# Patient Record
Sex: Female | Born: 1945 | Race: White | Hispanic: No | Smoking: Never smoker
Health system: Southern US, Community
[De-identification: ages and names within clinical notes are randomized; demographics above are authoritative.]

## PROBLEM LIST (undated history)

## (undated) DIAGNOSIS — E785 Hyperlipidemia, unspecified: Secondary | ICD-10-CM

## (undated) DIAGNOSIS — N200 Calculus of kidney: Secondary | ICD-10-CM

## (undated) DIAGNOSIS — M810 Age-related osteoporosis without current pathological fracture: Secondary | ICD-10-CM

## (undated) DIAGNOSIS — N6019 Diffuse cystic mastopathy of unspecified breast: Secondary | ICD-10-CM

## (undated) DIAGNOSIS — Z87442 Personal history of urinary calculi: Secondary | ICD-10-CM

## (undated) HISTORY — DX: Calculus of kidney: N20.0

---
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
## 2004-04-13 ENCOUNTER — Ambulatory Visit: Payer: Self-pay | Admitting: Internal Medicine

## 2005-05-03 ENCOUNTER — Ambulatory Visit: Payer: Self-pay | Admitting: Internal Medicine

## 2006-05-08 ENCOUNTER — Ambulatory Visit: Payer: Self-pay | Admitting: Internal Medicine

## 2007-06-10 ENCOUNTER — Ambulatory Visit: Payer: Self-pay | Admitting: Internal Medicine

## 2008-06-17 ENCOUNTER — Ambulatory Visit: Payer: Self-pay | Admitting: Internal Medicine

## 2009-06-20 ENCOUNTER — Ambulatory Visit: Payer: Self-pay | Admitting: Internal Medicine

## 2010-06-21 ENCOUNTER — Ambulatory Visit: Payer: Self-pay | Admitting: Internal Medicine

## 2010-08-11 ENCOUNTER — Ambulatory Visit: Payer: Self-pay | Admitting: Gastroenterology

## 2010-08-15 LAB — PATHOLOGY REPORT

## 2011-02-20 ENCOUNTER — Ambulatory Visit: Payer: Self-pay

## 2011-06-26 ENCOUNTER — Ambulatory Visit: Payer: Self-pay | Admitting: Internal Medicine

## 2011-07-02 ENCOUNTER — Ambulatory Visit: Payer: Self-pay | Admitting: Internal Medicine

## 2012-02-26 ENCOUNTER — Ambulatory Visit: Payer: Self-pay | Admitting: Internal Medicine

## 2012-02-26 LAB — URINALYSIS, COMPLETE
Bilirubin,UR: NEGATIVE
Ketone: NEGATIVE
Ph: 8 (ref 4.5–8.0)
RBC,UR: >30 /HPF (ref 0–5)
Specific Gravity: 1.005 (ref 1.003–1.030)
WBC UR: >30 /HPF (ref 0–5)

## 2012-02-28 LAB — URINE CULTURE

## 2012-06-11 ENCOUNTER — Ambulatory Visit: Payer: Self-pay | Admitting: Family Medicine

## 2012-07-28 ENCOUNTER — Ambulatory Visit: Payer: Self-pay | Admitting: Internal Medicine

## 2013-08-25 ENCOUNTER — Ambulatory Visit: Payer: Self-pay | Admitting: Internal Medicine

## 2014-08-16 DIAGNOSIS — M810 Age-related osteoporosis without current pathological fracture: Secondary | ICD-10-CM | POA: Insufficient documentation

## 2014-08-16 DIAGNOSIS — E785 Hyperlipidemia, unspecified: Secondary | ICD-10-CM | POA: Insufficient documentation

## 2014-08-27 ENCOUNTER — Ambulatory Visit: Payer: Self-pay | Admitting: Internal Medicine

## 2015-08-12 DIAGNOSIS — D1801 Hemangioma of skin and subcutaneous tissue: Secondary | ICD-10-CM | POA: Diagnosis not present

## 2015-08-12 DIAGNOSIS — Z1283 Encounter for screening for malignant neoplasm of skin: Secondary | ICD-10-CM | POA: Diagnosis not present

## 2015-08-12 DIAGNOSIS — L821 Other seborrheic keratosis: Secondary | ICD-10-CM | POA: Diagnosis not present

## 2015-08-12 DIAGNOSIS — D2371 Other benign neoplasm of skin of right lower limb, including hip: Secondary | ICD-10-CM | POA: Diagnosis not present

## 2015-08-26 DIAGNOSIS — Z0001 Encounter for general adult medical examination with abnormal findings: Secondary | ICD-10-CM | POA: Diagnosis not present

## 2015-08-26 DIAGNOSIS — E7800 Pure hypercholesterolemia, unspecified: Secondary | ICD-10-CM | POA: Diagnosis not present

## 2015-08-26 DIAGNOSIS — Z1231 Encounter for screening mammogram for malignant neoplasm of breast: Secondary | ICD-10-CM | POA: Diagnosis not present

## 2015-08-26 DIAGNOSIS — M810 Age-related osteoporosis without current pathological fracture: Secondary | ICD-10-CM | POA: Diagnosis not present

## 2015-09-08 ENCOUNTER — Other Ambulatory Visit: Payer: Self-pay | Admitting: Internal Medicine

## 2015-09-08 DIAGNOSIS — Z1231 Encounter for screening mammogram for malignant neoplasm of breast: Secondary | ICD-10-CM

## 2015-09-15 ENCOUNTER — Ambulatory Visit
Admission: RE | Admit: 2015-09-15 | Discharge: 2015-09-15 | Disposition: A | Discharge status: Home / Self Care | Payer: PPO | Source: Ambulatory Visit | Attending: Internal Medicine | Admitting: Internal Medicine

## 2015-09-15 DIAGNOSIS — Z1231 Encounter for screening mammogram for malignant neoplasm of breast: Secondary | ICD-10-CM | POA: Diagnosis not present

## 2015-09-15 DIAGNOSIS — K625 Hemorrhage of anus and rectum: Secondary | ICD-10-CM | POA: Diagnosis not present

## 2015-09-15 DIAGNOSIS — Z86010 Personal history of colonic polyps: Secondary | ICD-10-CM | POA: Diagnosis not present

## 2015-10-20 DIAGNOSIS — H2513 Age-related nuclear cataract, bilateral: Secondary | ICD-10-CM | POA: Diagnosis not present

## 2015-10-20 DIAGNOSIS — H401131 Primary open-angle glaucoma, bilateral, mild stage: Secondary | ICD-10-CM | POA: Diagnosis not present

## 2015-10-28 ENCOUNTER — Encounter: Payer: Self-pay | Admitting: *Deleted

## 2015-11-01 ENCOUNTER — Ambulatory Visit
Admission: RE | Admit: 2015-11-01 | Discharge: 2015-11-01 | Disposition: A | Discharge status: Home / Self Care | Payer: PPO | Source: Ambulatory Visit | Attending: Gastroenterology | Admitting: Gastroenterology

## 2015-11-01 ENCOUNTER — Encounter
Admission: RE | Disposition: A | Discharge status: Home / Self Care | Payer: Self-pay | Source: Ambulatory Visit | Attending: Gastroenterology

## 2015-11-01 ENCOUNTER — Encounter: Payer: Self-pay | Admitting: *Deleted

## 2015-11-01 ENCOUNTER — Ambulatory Visit: Payer: PPO | Admitting: Anesthesiology

## 2015-11-01 DIAGNOSIS — D124 Benign neoplasm of descending colon: Secondary | ICD-10-CM | POA: Diagnosis not present

## 2015-11-01 DIAGNOSIS — E785 Hyperlipidemia, unspecified: Secondary | ICD-10-CM | POA: Diagnosis not present

## 2015-11-01 DIAGNOSIS — K5790 Diverticulosis of intestine, part unspecified, without perforation or abscess without bleeding: Secondary | ICD-10-CM | POA: Diagnosis not present

## 2015-11-01 DIAGNOSIS — M810 Age-related osteoporosis without current pathological fracture: Secondary | ICD-10-CM | POA: Diagnosis not present

## 2015-11-01 DIAGNOSIS — Z79899 Other long term (current) drug therapy: Secondary | ICD-10-CM | POA: Diagnosis not present

## 2015-11-01 DIAGNOSIS — K5730 Diverticulosis of large intestine without perforation or abscess without bleeding: Secondary | ICD-10-CM | POA: Insufficient documentation

## 2015-11-01 DIAGNOSIS — K635 Polyp of colon: Secondary | ICD-10-CM | POA: Diagnosis not present

## 2015-11-01 DIAGNOSIS — Z86010 Personal history of colonic polyps: Secondary | ICD-10-CM | POA: Insufficient documentation

## 2015-11-01 DIAGNOSIS — N6019 Diffuse cystic mastopathy of unspecified breast: Secondary | ICD-10-CM | POA: Diagnosis not present

## 2015-11-01 DIAGNOSIS — K644 Residual hemorrhoidal skin tags: Secondary | ICD-10-CM | POA: Insufficient documentation

## 2015-11-01 HISTORY — PX: COLONOSCOPY WITH PROPOFOL: SHX5780

## 2015-11-01 HISTORY — DX: Hyperlipidemia, unspecified: E78.5

## 2015-11-01 HISTORY — DX: Age-related osteoporosis without current pathological fracture: M81.0

## 2015-11-01 HISTORY — DX: Diffuse cystic mastopathy of unspecified breast: N60.19

## 2015-11-01 SURGERY — COLONOSCOPY WITH PROPOFOL
Anesthesia: General

## 2015-11-01 MED ORDER — SODIUM CHLORIDE 0.9 % IV SOLN
INTRAVENOUS | Status: DC
  Administered 2015-11-01: 1000 mL via INTRAVENOUS

## 2015-11-01 MED ORDER — SODIUM CHLORIDE 0.9 % IV SOLN: INTRAVENOUS | Status: DC

## 2015-11-01 MED ORDER — PROPOFOL 500 MG/50ML IV EMUL
INTRAVENOUS | Status: DC | PRN
  Administered 2015-11-01: 100 ug/kg/min via INTRAVENOUS

## 2015-11-01 MED ORDER — FENTANYL CITRATE (PF) 100 MCG/2ML IJ SOLN
INTRAMUSCULAR | Status: DC | PRN
  Administered 2015-11-01: 50 ug via INTRAVENOUS

## 2015-11-01 NOTE — Transfer of Care (Signed)
Immediate Anesthesia Transfer of Care Note  Patient: Linda Gentry  Procedure(s) Performed: Procedure(s): COLONOSCOPY WITH PROPOFOL (N/A)  Patient Location: PACU and Endoscopy Unit  Anesthesia Type:General  Level of Consciousness: sedated and responds to stimulation  Airway & Oxygen Therapy: Patient Spontanous Breathing and Patient connected to nasal cannula oxygen  Post-op Assessment: Report given to RN and Post -op Vital signs reviewed and stable  Post vital signs: Reviewed and stable  Last Vitals:  Filed Vitals:   11/01/15 0939 11/01/15 1136  BP: 144/72 120/66  Pulse: 75   Temp: 36.5 C 36.1 C  Resp: 16 14    Last Pain: There were no vitals filed for this visit.       Complications: No apparent anesthesia complications

## 2015-11-01 NOTE — H&P (Signed)
Outpatient short stay form Pre-procedure 11/01/2015 10:50 AM Lollie Sails MD  Primary Physician: Dr. Lottie Mussel  Reason for visit:  Colonoscopy  History of present illness:  Patient is a 70 year old female presenting today as above. She has personal history of adenomatous colon polyps and has been having some small amounts of bright red bleeding noted on 4 paper. She has no dyschezia. He denies any constipation. Her last colonoscopy was 06/30/2010 with a finding of a single adenoma. She takes no aspirin products or blood thinning agents.    Current facility-administered medications:  .  0.9 %  sodium chloride infusion, , Intravenous, Continuous, Lollie Sails, MD, Last Rate: 20 mL/hr at 11/01/15 1004, 1,000 mL at 11/01/15 1004 .  0.9 %  sodium chloride infusion, , Intravenous, Continuous, Lollie Sails, MD .  0.9 %  sodium chloride infusion, , Intravenous, Continuous, Lollie Sails, MD  Prescriptions prior to admission  Medication Sig Dispense Refill Last Dose  . Calcium-Magnesium-Vitamin D (CALCIUM 500 PO) Take 1,000 mg by mouth daily.     . Cholecalciferol 1000 units TBDP Take 1,000 Units by mouth daily.     . Multiple Vitamin (MULTIVITAMIN) capsule Take 1 capsule by mouth daily.     . pravastatin (PRAVACHOL) 20 MG tablet Take 20 mg by mouth daily.        No Known Allergies   Past Medical History  Diagnosis Date  . Fibrocystic breast disease   . Hyperlipidemia   . Osteoporosis     Review of systems:      Physical Exam    Heart and lungs: Regular rate and rhythm without rub or gallop, lungs are bilaterally clear.    HEENT: Normocephalic atraumatic eyes are anicteric    Other:     Pertinant exam for procedure: Soft nontender nondistended bowel sounds positive normoactive.    Planned proceedures: Colonoscopy and indicated procedures. I have discussed the risks benefits and complications of procedures to include not limited to bleeding, infection,  perforation and the risk of sedation and the patient wishes to proceed.    Lollie Sails, MD Gastroenterology 11/01/2015  10:50 AM

## 2015-11-01 NOTE — Op Note (Signed)
[facility] Gastroenterology Patient Name: Linda Gentry Procedure Date: 11/01/2015 10:50 AM MRN: [id number] Account #: [id number] Date of Birth: 11-20-1945 Admit Type: Outpatient Age: 70 Room: [facility] ENDO ROOM 3 Gender: Female Note Status: Finalized Procedure:            Colonoscopy Indications:          Personal history of colonic polyps Providers:            Lollie Sails, MD Referring MD:         Hewitt Blade. Sarina Ser, MD (Referring MD) Medicines:            Monitored Anesthesia Care Complications:        No immediate complications. Procedure:            Pre-Anesthesia Assessment:                       - ASA Grade Assessment: II - A patient with mild                        systemic disease.                       After obtaining informed consent, the colonoscope was                        passed under direct vision. Throughout the procedure,                        the patient's blood pressure, pulse, and oxygen                        saturations were monitored continuously. The                        Colonoscope was introduced through the anus and                        advanced to the the cecum, identified by appendiceal                        orifice and ileocecal valve. The colonoscopy was                        performed without difficulty. The colonoscopy was                        performed with moderate difficulty due to a tortuous                        colon. The patient tolerated the procedure well. The                        quality of the bowel preparation was good. Findings:      A 4 mm polyp was found in the proximal descending colon. The polyp was       pedunculated. The polyp was removed with a cold biopsy forceps. The       polyp was removed with a cold snare. Resection and retrieval were       complete.      from appearance this could also be a pedunculated tag.  Multiple small and large-mouthed diverticula were found in the sigmoid        colon and descending colon.      The retroflexed view of the distal rectum and anal verge was normal and       showed no anal or rectal abnormalities.      The digital rectal exam was normal.      The perianal exam findings include skin tags. Impression:           - One 4 mm polyp in the proximal descending colon,                        removed with a cold snare and removed with a cold                        biopsy forceps. Resected and retrieved.                       - Diverticulosis in the sigmoid colon and in the                        descending colon.                       - The distal rectum and anal verge are normal on                        retroflexion view.                       - Perianal skin tags found on perianal exam. Recommendation:       - Await pathology results.                       - Telephone GI clinic for pathology results in 1 week. Procedure Code(s):    --- Professional ---                       [id number], Colonoscopy, flexible; with removal of tumor(s),                        polyp(s), or other lesion(s) by snare technique Diagnosis Code(s):    --- Professional ---                       D12.4, Benign neoplasm of descending colon                       K64.4, Residual hemorrhoidal skin tags                       Z86.010, Personal history of colonic polyps                       K57.30, Diverticulosis of large intestine without                        perforation or abscess without bleeding CPT copyright 2016 [employer]. All rights reserved. The codes documented in this report are preliminary and upon coder review may  be revised to meet current compliance requirements. Lollie Sails, MD 11/01/2015 11:36:24 AM This report has been signed electronically. Number  of Addenda: 0 Note Initiated On: 11/01/2015 10:50 AM Scope Withdrawal Time: 0 hours 4 minutes 45 seconds  Total Procedure Duration: 0 hours 22 minutes 54 seconds       [facility]

## 2015-11-01 NOTE — Anesthesia Preprocedure Evaluation (Signed)
Anesthesia Evaluation  Patient identified by MRN, date of birth, ID band Patient awake    Reviewed: Allergy & Precautions, NPO status , Patient's Chart, lab work & pertinent test results, reviewed documented beta blocker date and time   Airway Mallampati: II  TM Distance: >3 FB     Dental  (+) Chipped   Pulmonary          Cardiovascular     Neuro/Psych    GI/Hepatic   Endo/Other    Renal/GU      Musculoskeletal   Abdominal   Peds  Hematology   Anesthesia Other Findings   Reproductive/Obstetrics                             Anesthesia Physical Anesthesia Plan  ASA: II  Anesthesia Plan: General   Post-op Pain Management:    Induction: Intravenous  Airway Management Planned: Nasal Cannula  Additional Equipment:   Intra-op Plan:   Post-operative Plan:   Informed Consent: I have reviewed the patients History and Physical, chart, labs and discussed the procedure including the risks, benefits and alternatives for the proposed anesthesia with the patient or authorized representative who has indicated his/her understanding and acceptance.     Plan Discussed with: CRNA  Anesthesia Plan Comments:         Anesthesia Quick Evaluation  

## 2015-11-01 NOTE — Anesthesia Postprocedure Evaluation (Signed)
Anesthesia Post Note  Patient: Linda Gentry  Procedure(s) Performed: Procedure(s) (LRB): COLONOSCOPY WITH PROPOFOL (N/A)  Patient location during evaluation: Endoscopy Anesthesia Type: General Level of consciousness: awake and alert Pain management: pain level controlled Vital Signs Assessment: post-procedure vital signs reviewed and stable Respiratory status: spontaneous breathing, nonlabored ventilation, respiratory function stable and patient connected to nasal cannula oxygen Cardiovascular status: blood pressure returned to baseline and stable Postop Assessment: no signs of nausea or vomiting Anesthetic complications: no    Last Vitals:  Filed Vitals:   11/01/15 1146 11/01/15 1156  BP: 148/79 151/68  Pulse: 68 69  Temp:    Resp: 16 15    Last Pain: There were no vitals filed for this visit.               Sharline Lehane S

## 2015-11-02 ENCOUNTER — Encounter: Payer: Self-pay | Admitting: Gastroenterology

## 2015-11-03 DIAGNOSIS — Z87898 Personal history of other specified conditions: Secondary | ICD-10-CM | POA: Diagnosis not present

## 2015-11-03 DIAGNOSIS — R1032 Left lower quadrant pain: Secondary | ICD-10-CM | POA: Diagnosis not present

## 2015-11-03 LAB — SURGICAL PATHOLOGY

## 2015-11-05 ENCOUNTER — Ambulatory Visit
Admission: EM | Admit: 2015-11-05 | Discharge: 2015-11-05 | Disposition: A | Discharge status: Home / Self Care | Payer: PPO | Attending: Family Medicine | Admitting: Family Medicine

## 2015-11-05 ENCOUNTER — Encounter: Payer: Self-pay | Admitting: Gynecology

## 2015-11-05 ENCOUNTER — Ambulatory Visit (INDEPENDENT_AMBULATORY_CARE_PROVIDER_SITE_OTHER): Payer: PPO

## 2015-11-05 DIAGNOSIS — R1012 Left upper quadrant pain: Secondary | ICD-10-CM | POA: Diagnosis not present

## 2015-11-05 DIAGNOSIS — S3991XA Unspecified injury of abdomen, initial encounter: Secondary | ICD-10-CM | POA: Diagnosis not present

## 2015-11-05 DIAGNOSIS — N200 Calculus of kidney: Secondary | ICD-10-CM | POA: Diagnosis not present

## 2015-11-05 DIAGNOSIS — R109 Unspecified abdominal pain: Secondary | ICD-10-CM | POA: Diagnosis not present

## 2015-11-05 DIAGNOSIS — R1010 Upper abdominal pain, unspecified: Secondary | ICD-10-CM | POA: Diagnosis not present

## 2015-11-05 DIAGNOSIS — N390 Urinary tract infection, site not specified: Secondary | ICD-10-CM | POA: Insufficient documentation

## 2015-11-05 LAB — URINALYSIS COMPLETE WITH MICROSCOPIC (ARMC ONLY)
Bilirubin Urine: NEGATIVE
Glucose, UA: NEGATIVE mg/dL
Ketones, ur: NEGATIVE mg/dL
Nitrite: POSITIVE — AB
PROTEIN: NEGATIVE mg/dL
SQUAMOUS EPITHELIAL / LPF: NONE SEEN
Specific Gravity, Urine: 1.01 (ref 1.005–1.030)
pH: 7 (ref 5.0–8.0)

## 2015-11-05 MED ORDER — SULFAMETHOXAZOLE-TRIMETHOPRIM 800-160 MG PO TABS: 1.0000 | ORAL_TABLET | Freq: Two times a day (BID) | ORAL | Status: DC

## 2015-11-05 MED ORDER — TAMSULOSIN HCL 0.4 MG PO CAPS: 0.4000 mg | ORAL_CAPSULE | Freq: Every day | ORAL | Status: AC

## 2015-11-05 MED ORDER — ACETAMINOPHEN 500 MG PO TABS: 500.0000 mg | ORAL_TABLET | Freq: Four times a day (QID) | ORAL | Status: DC | PRN

## 2015-11-05 MED ORDER — ONDANSETRON 8 MG PO TBDP: 8.0000 mg | ORAL_TABLET | Freq: Two times a day (BID) | ORAL | Status: AC | PRN

## 2015-11-05 NOTE — Discharge Instructions (Signed)
Abdominal Pain, Adult Many things can cause abdominal pain. Usually, abdominal pain is not caused by a disease and will improve without treatment. It can often be observed and treated at home. Your health care provider will do a physical exam and possibly order blood tests and X-rays to help determine the seriousness of your pain. However, in many cases, more time must pass before a clear cause of the pain can be found. Before that point, your health care provider may not know if you need more testing or further treatment. HOME CARE INSTRUCTIONS Monitor your abdominal pain for any changes. The following actions may help to alleviate any discomfort you are experiencing:  Only take over-the-counter or prescription medicines as directed by your health care provider.  Do not take laxatives unless directed to do so by your health care provider.  Try a clear liquid diet (broth, tea, or water) as directed by your health care provider. Slowly move to a bland diet as tolerated. SEEK MEDICAL CARE IF:  You have unexplained abdominal pain.  You have abdominal pain associated with nausea or diarrhea.  You have pain when you urinate or have a bowel movement.  You experience abdominal pain that wakes you in the night.  You have abdominal pain that is worsened or improved by eating food.  You have abdominal pain that is worsened with eating fatty foods.  You have a fever. SEEK IMMEDIATE MEDICAL CARE IF:  Your pain does not go away within 2 hours.  You keep throwing up (vomiting).  Your pain is felt only in portions of the abdomen, such as the right side or the left lower portion of the abdomen.  You pass bloody or black tarry stools. MAKE SURE YOU:  Understand these instructions.  Will watch your condition.  Will get help right away if you are not doing well or get worse.   This information is not intended to replace advice given to you by your health care provider. Make sure you discuss  any questions you have with your health care provider.   Document Released: 02/28/2005 Document Revised: 02/09/2015 Document Reviewed: 01/28/2013 Elsevier Interactive Patient Education 2016 [place] Guidelines to Help Prevent Kidney Stones Your risk of kidney stones can be decreased by adjusting the foods you eat. The most important thing you can do is drink enough fluid. You should drink enough fluid to keep your urine clear or pale yellow. The following guidelines provide specific information for the type of kidney stone you have had. GUIDELINES ACCORDING TO TYPE OF KIDNEY STONE Calcium Oxalate Kidney Stones  Reduce the amount of salt you eat. Foods that have a lot of salt cause your body to release excess calcium into your urine. The excess calcium can combine with a substance called oxalate to form kidney stones.  Reduce the amount of animal protein you eat if the amount you eat is excessive. Animal protein causes your body to release excess calcium into your urine. Ask your dietitian how much protein from animal sources you should be eating.  Avoid foods that are high in oxalates. If you take vitamins, they should have less than 500 mg of vitamin C. Your body turns vitamin C into oxalates. You do not need to avoid fruits and vegetables high in vitamin C. Calcium Phosphate Kidney Stones  Reduce the amount of salt you eat to help prevent the release of excess calcium into your urine.  Reduce the amount of animal protein you eat if the amount  you eat is excessive. Animal protein causes your body to release excess calcium into your urine. Ask your dietitian how much protein from animal sources you should be eating.  Get enough calcium from food or take a calcium supplement (ask your dietitian for recommendations). Food sources of calcium that do not increase your risk of kidney stones include:  Broccoli.  Dairy products, such as cheese and yogurt.  Pudding. Uric Acid  Kidney Stones  Do not have more than 6 oz of animal protein per day. FOOD SOURCES Animal Protein Sources  Meat (all types).  Poultry.  Eggs.  Fish, seafood. Foods High in [employer] seasonings.  Soy sauce.  Teriyaki sauce.  Cured and processed meats.  Salted crackers and snack foods.  Fast food.  Canned soups and most canned foods. Foods High in Oxalates  Grains:  Amaranth.  Barley.  Grits.  Wheat germ.  Bran.  Buckwheat flour.  All bran cereals.  Pretzels.  Whole wheat bread.  Vegetables:  Beans (wax).  Beets and beet greens.  Collard greens.  Eggplant.  [place].  Leeks.  Okra.  Parsley.  [place]as.  Spinach.  Swiss chard.  Tomato paste.  Fried potatoes.  Sweet potatoes.  Fruits:  Red currants.  Figs.  Kiwi.  Rhubarb.  Meat and Other Protein Sources:  Beans (dried).  Soy burgers and other soybean products.  Miso.  Nuts (peanuts, almonds, pecans, cashews, hazelnuts).  Nut butters.  Sesame seeds and tahini (paste made of sesame seeds).  Poppy seeds.  Beverages:  Chocolate drink mixes.  Soy milk.  Instant iced tea.  Juices made from high-oxalate fruits or vegetables.  Other:  Carob.  Chocolate.  Fruitcake.  Marmalades.   This information is not intended to replace advice given to you by your health care provider. Make sure you discuss any questions you have with your health care provider.   Document Released: 09/15/2010 Document Revised: 05/26/2013 Document Reviewed: 04/17/2013 Elsevier Interactive Patient Education 2016 Elsevier Inc[employer] Nausea and Vomiting Nausea is a sick feeling that often comes before throwing up (vomiting). Vomiting is a reflex where stomach contents come out of your mouth. Vomiting can cause severe loss of body fluids (dehydration). Children and elderly adults can become dehydrated quickly, especially if they also have diarrhea. Nausea and vomiting are symptoms of a  condition or disease. It is important to find the cause of your symptoms. CAUSES   Direct irritation of the stomach lining. This irritation can result from increased acid production (gastroesophageal reflux disease), infection, food poisoning, taking certain medicines (such as nonsteroidal anti-inflammatory drugs), alcohol use, or tobacco use.  Signals from the brain.These signals could be caused by a headache, heat exposure, an inner ear disturbance, increased pressure in the brain from injury, infection, a tumor, or a concussion, pain, emotional stimulus, or metabolic problems.  An obstruction in the gastrointestinal tract (bowel obstruction).  Illnesses such as diabetes, hepatitis, gallbladder problems, appendicitis, kidney problems, cancer, sepsis, atypical symptoms of a heart attack, or eating disorders.  Medical treatments such as chemotherapy and radiation.  Receiving medicine that makes you sleep (general anesthetic) during surgery. DIAGNOSIS Your caregiver may ask for tests to be done if the problems do not improve after a few days. Tests may also be done if symptoms are severe or if the reason for the nausea and vomiting is not clear. Tests may include:  Urine tests.  Blood tests.  Stool tests.  Cultures (to look for evidence of infection).  X-rays or other imaging  studies. Test results can help your caregiver make decisions about treatment or the need for additional tests. TREATMENT You need to stay well hydrated. Drink frequently but in small amounts.You may wish to drink water, sports drinks, clear broth, or eat frozen ice pops or gelatin dessert to help stay hydrated.When you eat, eating slowly may help prevent nausea.There are also some antinausea medicines that may help prevent nausea. HOME CARE INSTRUCTIONS   Take all medicine as directed by your caregiver.  If you do not have an appetite, do not force yourself to eat. However, you must continue to drink  fluids.  If you have an appetite, eat a normal diet unless your caregiver tells you differently.  Eat a variety of complex carbohydrates (rice, wheat, potatoes, bread), lean meats, yogurt, fruits, and vegetables.  Avoid high-fat foods because they are more difficult to digest.  Drink enough water and fluids to keep your urine clear or pale yellow.  If you are dehydrated, ask your caregiver for specific rehydration instructions. Signs of dehydration may include:  Severe thirst.  Dry lips and mouth.  Dizziness.  Dark urine.  Decreasing urine frequency and amount.  Confusion.  Rapid breathing or pulse. SEEK IMMEDIATE MEDICAL CARE IF:   You have blood or brown flecks (like coffee grounds) in your vomit.  You have black or bloody stools.  You have a severe headache or stiff neck.  You are confused.  You have severe abdominal pain.  You have chest pain or trouble breathing.  You do not urinate at least once every 8 hours.  You develop cold or clammy skin.  You continue to vomit for longer than 24 to 48 hours.  You have a fever. MAKE SURE YOU:   Understand these instructions.  Will watch your condition.  Will get help right away if you are not doing well or get worse.   This information is not intended to replace advice given to you by your health care provider. Make sure you discuss any questions you have with your health care provider.   Document Released: 05/21/2005 Document Revised: 08/13/2011 Document Reviewed: 10/18/2010 Elsevier Interactive Patient Education 2016 [employer]. Constipation, Adult Constipation is when a person has fewer than three bowel movements a week, has difficulty having a bowel movement, or has stools that are dry, hard, or larger than normal. As people grow older, constipation is more common. A low-fiber diet, not taking in enough fluids, and taking certain medicines may make constipation worse.  CAUSES   Certain medicines, such as  antidepressants, pain medicine, iron supplements, antacids, and water pills.   Certain diseases, such as diabetes, irritable bowel syndrome (IBS), thyroid disease, or depression.   Not drinking enough water.   Not eating enough fiber-rich foods.   Stress or travel.   Lack of physical activity or exercise.   Ignoring the urge to have a bowel movement.   Using laxatives too much.  SIGNS AND SYMPTOMS   Having fewer than three bowel movements a week.   Straining to have a bowel movement.   Having stools that are hard, dry, or larger than normal.   Feeling full or bloated.   Pain in the lower abdomen.   Not feeling relief after having a bowel movement.  DIAGNOSIS  Your health care provider will take a medical history and perform a physical exam. Further testing may be done for severe constipation. Some tests may include:  A barium enema X-ray to examine your rectum, colon, and, sometimes, your  small intestine.   A sigmoidoscopy to examine your lower colon.   A colonoscopy to examine your entire colon. TREATMENT  Treatment will depend on the severity of your constipation and what is causing it. Some dietary treatments include drinking more fluids and eating more fiber-rich foods. Lifestyle treatments may include regular exercise. If these diet and lifestyle recommendations do not help, your health care provider may recommend taking over-the-counter laxative medicines to help you have bowel movements. Prescription medicines may be prescribed if over-the-counter medicines do not work.  HOME CARE INSTRUCTIONS   Eat foods that have a lot of fiber, such as fruits, vegetables, whole grains, and beans.  Limit foods high in fat and processed sugars, such as french fries, hamburgers, cookies, candies, and soda.   A fiber supplement may be added to your diet if you cannot get enough fiber from foods.   Drink enough fluids to keep your urine clear or pale yellow.    Exercise regularly or as directed by your health care provider.   Go to the restroom when you have the urge to go. Do not hold it.   Only take over-the-counter or prescription medicines as directed by your health care provider. Do not take other medicines for constipation without talking to your health care provider first.  [place] IF:   You have bright red blood in your stool.   Your constipation lasts for more than 4 days or gets worse.   You have abdominal or rectal pain.   You have thin, pencil-like stools.   You have unexplained weight loss. MAKE SURE YOU:   Understand these instructions.  Will watch your condition.  Will get help right away if you are not doing well or get worse.   This information is not intended to replace advice given to you by your health care provider. Make sure you discuss any questions you have with your health care provider.   Document Released: 02/17/2004 Document Revised: 06/11/2014 Document Reviewed: 03/02/2013 Elsevier Interactive Patient Education 2016 [place]. Kidney Stones Kidney stones (urolithiasis) are deposits that form inside your kidneys. The intense pain is caused by the stone moving through the urinary tract. When the stone moves, the ureter goes into spasm around the stone. The stone is usually passed in the urine.  CAUSES   A disorder that makes certain neck glands produce too much parathyroid hormone (primary hyperparathyroidism).  A buildup of uric acid crystals, similar to gout in your joints.  Narrowing (stricture) of the ureter.  A kidney obstruction present at birth (congenital obstruction).  Previous surgery on the kidney or ureters.  Numerous kidney infections. SYMPTOMS   Feeling sick to your stomach (nauseous).  Throwing up (vomiting).  Blood in the urine (hematuria).  Pain that usually spreads (radiates) to the groin.  Frequency or urgency of urination. DIAGNOSIS    Taking a history and physical exam.  Blood or urine tests.  CT scan.  Occasionally, an examination of the inside of the urinary bladder (cystoscopy) is performed. TREATMENT   Observation.  Increasing your fluid intake.  Extracorporeal shock wave lithotripsy--This is a noninvasive procedure that uses shock waves to break up kidney stones.  Surgery may be needed if you have severe pain or persistent obstruction. There are various surgical procedures. Most of the procedures are performed with the use of small instruments. Only small incisions are needed to accommodate these instruments, so recovery time is minimized. The size, location, and chemical composition are all important variables  that will determine the proper choice of action for you. Talk to your health care provider to better understand your situation so that you will minimize the risk of injury to yourself and your kidney.  HOME CARE INSTRUCTIONS   Drink enough water and fluids to keep your urine clear or pale yellow. This will help you to pass the stone or stone fragments.  Strain all urine through the provided strainer. Keep all particulate matter and stones for your health care provider to see. The stone causing the pain may be as small as a grain of salt. It is very important to use the strainer each and every time you pass your urine. The collection of your stone will allow your health care provider to analyze it and verify that a stone has actually passed. The stone analysis will often identify what you can do to reduce the incidence of recurrences.  Only take over-the-counter or prescription medicines for pain, discomfort, or fever as directed by your health care provider.  Keep all follow-up visits as told by your health care provider. This is important.  Get follow-up X-rays if required. The absence of pain does not always mean that the stone has passed. It may have only stopped moving. If the urine remains  completely obstructed, it can cause loss of kidney function or even complete destruction of the kidney. It is your responsibility to make sure X-rays and follow-ups are completed. Ultrasounds of the kidney can show blockages and the status of the kidney. Ultrasounds are not associated with any radiation and can be performed easily in a matter of minutes.  Make changes to your daily diet as told by your health care provider. You may be told to:  Limit the amount of salt that you eat.  Eat 5 or more servings of fruits and vegetables each day.  Limit the amount of meat, poultry, fish, and eggs that you eat.  Collect a 24-hour urine sample as told by your health care provider.You may need to collect another urine sample every 6-12 months. SEEK MEDICAL CARE IF:  You experience pain that is progressive and unresponsive to any pain medicine you have been prescribed. SEEK IMMEDIATE MEDICAL CARE IF:   Pain cannot be controlled with the prescribed medicine.  You have a fever or shaking chills.  The severity or intensity of pain increases over 18 hours and is not relieved by pain medicine.  You develop a new onset of abdominal pain.  You feel faint or pass out.  You are unable to urinate.   This information is not intended to replace advice given to you by your health care provider. Make sure you discuss any questions you have with your health care provider.   Document Released: 05/21/2005 Document Revised: 02/09/2015 Document Reviewed: 10/22/2012 Elsevier Interactive Patient Education 2016 Elsevier Inc. Urinary Tract Infection Urinary tract infections (UTIs) can develop anywhere along your urinary tract. Your urinary tract is your body's drainage system for removing wastes and extra water. Your urinary tract includes two kidneys, two ureters, a bladder, and a urethra. Your kidneys are a pair of bean-shaped organs. Each kidney is about the size of your fist. They are located below your ribs,  one on each side of your spine. CAUSES Infections are caused by microbes, which are microscopic organisms, including fungi, viruses, and bacteria. These organisms are so small that they can only be seen through a microscope. Bacteria are the microbes that most commonly cause UTIs. SYMPTOMS  Symptoms of UTIs  may vary by age and gender of the patient and by the location of the infection. Symptoms in young women typically include a frequent and intense urge to urinate and a painful, burning feeling in the bladder or urethra during urination. Older women and men are more likely to be tired, shaky, and weak and have muscle aches and abdominal pain. A fever may mean the infection is in your kidneys. Other symptoms of a kidney infection include pain in your back or sides below the ribs, nausea, and vomiting. DIAGNOSIS To diagnose a UTI, your caregiver will ask you about your symptoms. Your caregiver will also ask you to provide a urine sample. The urine sample will be tested for bacteria and white blood cells. White blood cells are made by your body to help fight infection. TREATMENT  Typically, UTIs can be treated with medication. Because most UTIs are caused by a bacterial infection, they usually can be treated with the use of antibiotics. The choice of antibiotic and length of treatment depend on your symptoms and the type of bacteria causing your infection. HOME CARE INSTRUCTIONS  If you were prescribed antibiotics, take them exactly as your caregiver instructs you. Finish the medication even if you feel better after you have only taken some of the medication.  Drink enough water and fluids to keep your urine clear or pale yellow.  Avoid caffeine, tea, and carbonated beverages. They tend to irritate your bladder.  Empty your bladder often. Avoid holding urine for long periods of time.  Empty your bladder before and after sexual intercourse.  After a bowel movement, women should cleanse from front  to back. Use each tissue only once. SEEK MEDICAL CARE IF:   You have back pain.  You develop a fever.  Your symptoms do not begin to resolve within 3 days. SEEK IMMEDIATE MEDICAL CARE IF:   You have severe back pain or lower abdominal pain.  You develop chills.  You have nausea or vomiting.  You have continued burning or discomfort with urination. MAKE SURE YOU:   Understand these instructions.  Will watch your condition.  Will get help right away if you are not doing well or get worse.   This information is not intended to replace advice given to you by your health care provider. Make sure you discuss any questions you have with your health care provider.   Document Released: 02/28/2005 Document Revised: 02/09/2015 Document Reviewed: 06/29/2011 Elsevier Interactive Patient Education [employer].

## 2015-11-05 NOTE — ED Provider Notes (Signed)
CSN: [id number]     Arrival date & time 11/05/15  [id number] History   First MD Initiated Contact with Patient 11/05/15 [phone]     Chief Complaint  Patient presents with  . Abdominal Pain   (Consider location/radiation/quality/duration/timing/severity/associated sxs/prior Treatment) HPI Comments: married caucasian female here for evaluation LUQ abdomen pain not improving since evaluated by Judd Lien after Colonoscopy 5/30 diagnosed diverticulitis  + chills, fatigue, nausea, vomiting, decreased appetite was not notified of xray results taken 11/02/2015 and would like to know results hasn't eaten anything today only fluids yesterday had bland/soft diet/meals (macaroni and cheese, baked potato, cereal)  Last threw up Tuesday pm all night.  Tried phazyme x 2 days without any change in symptoms last regular stool was prior to colonoscopy had diarrhea from prep earlier this week.  Last stool Thursday soft tan unformed Denied PMHx kidney stones or FHMx kidney stones  PMHx hyperlipidemia, osteoporosis  PSHx breast cyst aspiration FHx F heart attack  Patient is a 70 y.o. female presenting with abdominal pain. The history is provided by the patient.  Abdominal Pain Pain location:  LUQ Pain quality: aching   Pain radiates to:  L flank Pain severity:  Moderate Onset quality:  Sudden Duration:  4 days Timing:  Intermittent Progression:  Waxing and waning Chronicity:  New Context: awakening from sleep, previous surgery and retching   Context: not alcohol use, not diet changes, not eating, not laxative use, not medication withdrawal, not recent sexual activity, not recent travel, not sick contacts, not suspicious food intake and not trauma   Relieved by:  Nothing Ineffective treatments:  Urination, vomiting, position changes, palpation, OTC medications, lying down, movement, liquids, eating, flatus and belching Associated symptoms: belching, chills, constipation, diarrhea, fatigue, nausea and vomiting     Associated symptoms: no anorexia, no chest pain, no cough, no dysuria, no fever, no hematemesis, no hematochezia, no hematuria, no melena, no shortness of breath, no sore throat, no vaginal bleeding and no vaginal discharge     Past Medical History  Diagnosis Date  . Fibrocystic breast disease   . Hyperlipidemia   . Osteoporosis    Past Surgical History  Procedure Laterality Date  . Breast biopsy Left 1965  . Colonoscopy with propofol N/A 11/01/2015    Procedure: COLONOSCOPY WITH PROPOFOL;  Surgeon: Lollie Sails, MD;  Location: [facility] ENDOSCOPY;  Service: Endoscopy;  Laterality: N/A;   Family History  Problem Relation Age of Onset  . Breast cancer Neg Hx    Social History  Substance Use Topics  . Smoking status: Never Smoker   . Smokeless tobacco: Never Used  . Alcohol Use: No   OB History    No data available     Review of Systems  Constitutional: Positive for chills, activity change, appetite change and fatigue. Negative for fever and diaphoresis.  HENT: Negative for congestion, ear pain and sore throat.   Eyes: Negative for pain and discharge.  Respiratory: Negative for cough, shortness of breath and wheezing.   Cardiovascular: Negative for chest pain and leg swelling.  Gastrointestinal: Positive for nausea, vomiting, abdominal pain, diarrhea and constipation. Negative for blood in stool, melena, hematochezia, abdominal distention, anorexia and hematemesis.  Endocrine: Positive for cold intolerance. Negative for heat intolerance, polydipsia, polyphagia and polyuria.  Genitourinary: Negative for dysuria, hematuria, vaginal bleeding, vaginal discharge and difficulty urinating.  Musculoskeletal: Negative for myalgias, back pain, joint swelling, arthralgias, gait problem, neck pain and neck stiffness.  Skin: Negative for color change, pallor, rash and wound.  Allergic/Immunologic: Positive for environmental allergies. Negative for food allergies.  Neurological: Positive  for weakness. Negative for dizziness, tremors, seizures, syncope, facial asymmetry, speech difficulty, light-headedness, numbness and headaches.  Hematological: Negative for adenopathy. Does not bruise/bleed easily.  Psychiatric/Behavioral: Positive for sleep disturbance. Negative for confusion and agitation. The patient is not nervous/anxious.     Allergies  Review of patient's allergies indicates no known allergies.  Home Medications   Prior to Admission medications   Medication Sig Start Date End Date Taking? Authorizing Provider  Calcium-Magnesium-Vitamin D (CALCIUM 500 PO) Take 1,000 mg by mouth daily.   Yes Historical Provider, MD  Cholecalciferol 1000 units TBDP Take 1,000 Units by mouth daily.   Yes Historical Provider, MD  Multiple Vitamin (MULTIVITAMIN) capsule Take 1 capsule by mouth daily.   Yes Historical Provider, MD  pravastatin (PRAVACHOL) 20 MG tablet Take 20 mg by mouth daily.   Yes Historical Provider, MD  acetaminophen (TYLENOL) 500 MG tablet Take 1 tablet (500 mg total) by mouth every 6 (six) hours as needed. 11/05/15   Olen Cordial, NP  ondansetron (ZOFRAN ODT) 8 MG disintegrating tablet Take 1 tablet (8 mg total) by mouth 2 (two) times daily as needed for nausea or vomiting. 11/05/15 11/11/15  Olen Cordial, NP  sulfamethoxazole-trimethoprim (BACTRIM DS,SEPTRA DS) 800-160 MG tablet Take 1 tablet by mouth 2 (two) times daily. 11/05/15   Olen Cordial, NP  tamsulosin (FLOMAX) 0.4 MG CAPS capsule Take 1 capsule (0.4 mg total) by mouth daily. 11/05/15 11/18/15  Olen Cordial, NP   Meds Ordered and Administered this Visit  Medications - No data to display  BP 135/61 mmHg  Pulse 68  Temp(Src) 98.6 F (37 C) (Oral)  Resp 16  Ht 5\' 1"  (1.549 m)  Wt 120 lb (54.432 kg)  BMI 22.69 kg/m2  SpO2 99% No data found.   Physical Exam  Constitutional: She is oriented to person, place, and time. Vital signs are normal. She appears well-developed and well-nourished. She  is active and cooperative.  Non-toxic appearance. She does not have a sickly appearance. She does not appear ill. No distress.  HENT:  Head: Normocephalic and atraumatic.  Right Ear: Hearing, external ear and ear canal normal. A middle ear effusion is present.  Left Ear: Hearing, external ear and ear canal normal. A middle ear effusion is present.  Nose: Mucosal edema and rhinorrhea present. No nose lacerations, sinus tenderness, nasal deformity, septal deviation or nasal septal hematoma. No epistaxis.  No foreign bodies. Right sinus exhibits no maxillary sinus tenderness and no frontal sinus tenderness. Left sinus exhibits no maxillary sinus tenderness and no frontal sinus tenderness.  Mouth/Throat: Uvula is midline and mucous membranes are normal. Mucous membranes are not pale, not dry and not cyanotic. She does not have dentures. No oral lesions. No trismus in the jaw. Normal dentition. No dental abscesses, uvula swelling, lacerations or dental caries. Posterior oropharyngeal edema and posterior oropharyngeal erythema present. No oropharyngeal exudate or tonsillar abscesses.  Cobblestoning posterior pharynx; bilateral TMs with air fluid level clear; bilateral allergic shiners  Eyes: Conjunctivae, EOM and lids are normal. Pupils are equal, round, and reactive to light. Right eye exhibits no chemosis, no discharge, no exudate and no hordeolum. No foreign body present in the right eye. Left eye exhibits no chemosis, no discharge, no exudate and no hordeolum. No foreign body present in the left eye. Right conjunctiva is not injected. Right conjunctiva has no hemorrhage. Left conjunctiva is not injected. Left conjunctiva has  no hemorrhage. No scleral icterus. Right eye exhibits normal extraocular motion and no nystagmus. Left eye exhibits normal extraocular motion and no nystagmus. Right pupil is round and reactive. Left pupil is round and reactive. Pupils are equal.  Neck: Trachea normal and normal range of  motion. Neck supple. No tracheal tenderness, no spinous process tenderness and no muscular tenderness present. No rigidity. No tracheal deviation, no edema, no erythema and normal range of motion present. No thyroid mass and no thyromegaly present.  Cardiovascular: Normal rate, regular rhythm, S1 normal, S2 normal, normal heart sounds and intact distal pulses.  PMI is not displaced.  Exam reveals no gallop and no friction rub.   No murmur heard. Pulses:      Radial pulses are 2+ on the right side, and 2+ on the left side.  Pulmonary/Chest: Effort normal and breath sounds normal. No accessory muscle usage or stridor. No respiratory distress. She has no decreased breath sounds. She has no wheezes. She has no rhonchi. She has no rales. She exhibits no tenderness.  Abdominal: Soft. Normal appearance and bowel sounds are normal. She exhibits no shifting dullness, no distension, no pulsatile liver, no fluid wave, no abdominal bruit, no ascites, no pulsatile midline mass and no mass. There is no hepatosplenomegaly. There is tenderness in the left upper quadrant. There is no rigidity, no rebound, no guarding, no CVA tenderness, no tenderness at McBurney's point and negative Murphy's sign. Hernia confirmed negative in the ventral area.    Dull to percussion x 4 quads; normoactive bowel sounds  Musculoskeletal: Normal range of motion. She exhibits no edema or tenderness.       Right shoulder: Normal.       Left shoulder: Normal.       Right hip: Normal.       Left hip: Normal.       Right knee: Normal.       Left knee: Normal.       Cervical back: Normal.       Right hand: Normal.       Left hand: Normal.  Lymphadenopathy:       Head (right side): No submental, no submandibular, no tonsillar, no preauricular, no posterior auricular and no occipital adenopathy present.       Head (left side): No submental, no submandibular, no tonsillar, no preauricular, no posterior auricular and no occipital adenopathy  present.    She has no cervical adenopathy.       Right cervical: No superficial cervical, no deep cervical and no posterior cervical adenopathy present.      Left cervical: No superficial cervical, no deep cervical and no posterior cervical adenopathy present.  Neurological: She is alert and oriented to person, place, and time. She has normal strength. She is not disoriented. She displays no atrophy and no tremor. No cranial nerve deficit or sensory deficit. She exhibits normal muscle tone. She displays no seizure activity. Coordination and gait normal. GCS eye subscore is 4. GCS verbal subscore is 5. GCS motor subscore is 6.  Skin: Skin is warm, dry and intact. No abrasion, no bruising, no burn, no ecchymosis, no laceration, no lesion, no petechiae and no rash noted. She is not diaphoretic. No cyanosis or erythema. No pallor. Nails show no clubbing.  Psychiatric: She has a normal mood and affect. Her speech is normal and behavior is normal. Judgment and thought content normal. Cognition and memory are normal.  Nursing note and vitals reviewed.   ED Course  Procedures (  including critical care time)  Labs Review Labs Reviewed  URINALYSIS COMPLETEWITH MICROSCOPIC ([place]) - Abnormal; Notable for the following:    APPearance HAZY (*)    Hgb urine dipstick 2+ (*)    Nitrite POSITIVE (*)    Leukocytes, UA 2+ (*)    Bacteria, UA MANY (*)    All other components within normal limits  URINE CULTURE    Imaging Review Dg Abd 1 View  11/05/2015  CLINICAL DATA:  Left upper quadrant pain trauma vomiting, history of diverticulitis, recent colonoscopy 4 days ago. EXAM: ABDOMEN - 1 VIEW COMPARISON:  None available FINDINGS: Moderate gaseous distension of the transverse colon and splenic flexure. Retained stool in the cecum, left descending colon and sigmoid. No significant small bowel dilatation. Bones are osteopenic. Degenerative changes of the SI joints. Radiopaque 6 mm calculus projects over the  right kidney lower pole. IMPRESSION: Nonspecific gaseous distention of the colon with retained stool throughout. Mild ileus not excluded. Suspect right nephrolithiasis Osteopenia Electronically Signed   By: Jerilynn Mages.  Shick M.D.   On: 11/05/2015 09:55    0933 KUB pending saw [facility] Thursday tried phazyme not improving abdomen pain new diverticulitis diagnosis during CSP 5/30 but was not told she needed to start any new medications just increase fiber in diet  patient verbalized understanding information/instructions agreed plan of care and had no further questions at this time  1015 Discussed KUB results with patient right kidney stone and possible mild ileus s/p colonoscopy.  Discussed with patient and spouse to start flomax, strain urine, hydrate.  Discussed what typical kidney stone pain may present/progress to with patient and spouse.  Follow up with [facility] and consider urology consult next week.  ER if worsening abdomen pain with bland diet.  Urinalysis collection pending.  Patient sipping water.  Patient and spouse verbalized understanding information/instructions, agreed with plan of care and had no further questions at this time.  1023 cloudy yellow urine specimen collected by [place]  [id number] Discussed urinalysis results with patient and spouse UTI will start Bactrim DS po BID x 7 days.  Urine culture to follow typically 48 hours will call with results once available.  Patient and spouse verbalized understanding information/instructions, agreed with plan of care and had no further questions at this time.  Filed Vitals:   11/05/15 0910  BP: 135/61  Pulse: 68  Temp: 98.6 F (37 C)  TempSrc: Oral  Resp: 16  Height: 5\' 1"  (1.549 m)  Weight: 120 lb (54.432 kg)  SpO2: 99%    MDM   1. Nephrolithiasis   2. Pain of upper abdomen   3. UTI (lower urinary tract infection)    Medications as directed. Bactrim DS po BID x 7 days.  Patient is also to push fluids  Hydrate, avoid  dehydration.  Avoid holding urine void on frequent basis every 4 to 6 hours.  If unable to void every 8 hours follow up for re-evaluation with PCM, urgent care or ER.   Call or return to clinic as needed if these symptoms worsen or fail to improve as anticipated.  Exitcare handout on cystitis given to patient Patient verbalized agreement and understanding of treatment plan and had no further questions at this time. P2:  Hydrate and cranberry juice  Discussed KUB results and urinalysis results with patient and given copies of reports.  Urology appt to be scheduled if no stone passed in next 3 days.  Strain all urine.  Hydrate, hydrate.  Flomax 0.4mg  daily as directed.  Rx zofran 8mg  ODT po BID prn nausea/vomiting. Patient is also to push fluids and strain urine. Call or return to clinic as needed if these symptoms worsen or fail to improve as anticipated e.g. gross hematuria, fever, worsening pain, unable to void.  Exitcare handout on nephrolithiasis and dietary measures to prevent kidney stones given to patient.  Patient verbalized agreement and understanding of treatment plan and had no further questions at this time. P2:  Hydrate, post coital urination, and cranberry juice    I have recommended clear fluids and bland diet.  Avoid dairy/spicy, fried and large portions of meat while having nausea.  If vomiting hold po intake x 1 hour.  Then sips clear fluids like broths, ginger ale, power ade, gatorade, pedialyte may advance to soft/bland if no vomiting x 24 hours and appetite returned otherwise hydration main focus.     Return to the clinic if symptoms persist or worsen; I have alerted the patient to call if high fever, dehydration, marked weakness, fainting, increased abdominal pain, blood in stool or vomit (red or black) or go to ER for re-evaluation if [place] closed.   Exitcare handout on nausea/vomiting and abdomen pain given to patient. Spouse and Patient verbalized agreement and understanding of  treatment plan and had no further questions at this time.   Olen Cordial, NP 11/05/15 2200

## 2015-11-05 NOTE — ED Notes (Signed)
Patient c/o left side abdomen pain after colonoscopy on 11/01/15. Patient stated call her doctor and they did an xray on 11/02/15 but has not receive any result. Per patient doctor told her it could be gas pain.

## 2015-11-08 ENCOUNTER — Other Ambulatory Visit: Payer: Self-pay | Admitting: Internal Medicine

## 2015-11-08 ENCOUNTER — Telehealth: Payer: Self-pay | Admitting: Internal Medicine

## 2015-11-08 DIAGNOSIS — N200 Calculus of kidney: Secondary | ICD-10-CM | POA: Diagnosis not present

## 2015-11-08 NOTE — Telephone Encounter (Signed)
Telephone message left for patient e. Coli urine culture not resistant to bactrim DS  Continue with plan of care as previously discussed.  Will mail her copy of results to share with PCM as bacteria was resistant to some antibiotics in case she has another UTI this year for other providers and patients to be aware of what antibiotics this bacteria was resistant to.  Patient to contact clinic if further questions or concerns.

## 2015-11-09 LAB — URINE CULTURE
Culture: >=100000 — AB
SPECIAL REQUESTS: NORMAL

## 2015-11-17 ENCOUNTER — Ambulatory Visit
Admission: RE | Admit: 2015-11-17 | Discharge: 2015-11-17 | Disposition: A | Discharge status: Home / Self Care | Payer: PPO | Source: Ambulatory Visit | Attending: Internal Medicine | Admitting: Internal Medicine

## 2015-11-17 DIAGNOSIS — N132 Hydronephrosis with renal and ureteral calculous obstruction: Secondary | ICD-10-CM | POA: Diagnosis not present

## 2015-11-17 DIAGNOSIS — N200 Calculus of kidney: Secondary | ICD-10-CM | POA: Insufficient documentation

## 2015-11-17 DIAGNOSIS — N1330 Unspecified hydronephrosis: Secondary | ICD-10-CM | POA: Diagnosis not present

## 2015-11-17 DIAGNOSIS — R938 Abnormal findings on diagnostic imaging of other specified body structures: Secondary | ICD-10-CM | POA: Diagnosis not present

## 2015-12-01 DIAGNOSIS — N390 Urinary tract infection, site not specified: Secondary | ICD-10-CM | POA: Diagnosis not present

## 2015-12-01 DIAGNOSIS — R5381 Other malaise: Secondary | ICD-10-CM | POA: Diagnosis not present

## 2015-12-09 ENCOUNTER — Ambulatory Visit (INDEPENDENT_AMBULATORY_CARE_PROVIDER_SITE_OTHER): Payer: PPO | Admitting: Urology

## 2015-12-09 ENCOUNTER — Encounter: Payer: Self-pay | Admitting: Urology

## 2015-12-09 VITALS — BP 142/72 | HR 81 | Ht 61.0 in | Wt 119.2 lb

## 2015-12-09 DIAGNOSIS — N200 Calculus of kidney: Secondary | ICD-10-CM | POA: Diagnosis not present

## 2015-12-09 NOTE — Progress Notes (Signed)
12/09/2015 3:18 PM   Linda Gentry 11/17/1945 [id number]  Referring provider: Madelyn Brunner, MD [facility] [place], [place] [address]  Chief Complaint  Patient presents with  . New Patient (Initial Visit)  . Nephrolithiasis    HPI: The patient is a 70 year old female presents today for evaluation of left-sided nephrolithiasis. She is also being currently treated for Klebsiella pneumoniae urinary tract infection. On a recent renal ultrasound showed bilateral nephrolithiasis with a 5 mm stone in the right kidney and multiple stones with the largest 11 mm in the left. She also has suspected left UVJ stone 8 mm in size.  She has no personal history of nephrolithiasis before this. She is currently asymptomatic. She is on Cipro for Klebsiella pneumonia UTI. This is the second of saline UTIs she's had since June 2017.  PMH: Past Medical History  Diagnosis Date  . Fibrocystic breast disease   . Hyperlipidemia   . Osteoporosis   . Kidney stone     Surgical History: Past Surgical History  Procedure Laterality Date  . Breast biopsy Left 1965  . Colonoscopy with propofol N/A 11/01/2015    Procedure: COLONOSCOPY WITH PROPOFOL;  Surgeon: Lollie Sails, MD;  Location: [facility] ENDOSCOPY;  Service: Endoscopy;  Laterality: N/A;    Home Medications:    Medication List       This list is accurate as of: 12/09/15  3:18 PM.  Always use your most recent med list.               acetaminophen 500 MG tablet  Commonly known as:  TYLENOL  Take 1 tablet (500 mg total) by mouth every 6 (six) hours as needed.     CALCIUM 500 PO  Take 1,000 mg by mouth daily.     CALCIUM PO  Take by mouth.     Cholecalciferol 1000 units Tbdp  Take 1,000 Units by mouth daily.     ciprofloxacin 250 MG tablet  Commonly known as:  CIPRO  Take by mouth.     multivitamin capsule  Take 1 capsule by mouth daily.     pravastatin 20 MG tablet  Commonly known as:   PRAVACHOL  Take 20 mg by mouth daily.        Allergies: No Known Allergies  Family History: Family History  Problem Relation Age of Onset  . Breast cancer Neg Hx   . Kidney cancer Neg Hx   . Kidney disease Neg Hx   . Prostate cancer Neg Hx     Social History:  reports that she has never smoked. She has never used smokeless tobacco. She reports that she does not drink alcohol or use illicit drugs.  ROS: UROLOGY Frequent Urination?: No Hard to postpone urination?: No Burning/pain with urination?: No Get up at night to urinate?: Yes Leakage of urine?: No Urine stream starts and stops?: No Trouble starting stream?: No Do you have to strain to urinate?: No Blood in urine?: No Urinary tract infection?: Yes Sexually transmitted disease?: No Injury to kidneys or bladder?: No Painful intercourse?: No Weak stream?: No Currently pregnant?: No Vaginal bleeding?: No Last menstrual period?: N  Gastrointestinal Nausea?: No Vomiting?: No Indigestion/heartburn?: No Diarrhea?: No Constipation?: No  Constitutional Fever: No Night sweats?: Yes Weight loss?: No Fatigue?: Yes  Skin Skin rash/lesions?: No Itching?: No  Eyes Blurred vision?: No Double vision?: No  Ears/Nose/Throat Sore throat?: No Sinus problems?: No  Hematologic/Lymphatic Swollen glands?: No Easy bruising?: No  Cardiovascular Leg swelling?: No Chest pain?: No  Respiratory Cough?: No Shortness of breath?: No  Endocrine Excessive thirst?: No  Musculoskeletal Back pain?: No Joint pain?: No  Neurological Headaches?: No Dizziness?: No  Psychologic Depression?: No Anxiety?: No  Physical Exam: BP 142/72 mmHg  Pulse 81  Ht 5\' 1"  (1.549 m)  Wt 119 lb 3.2 oz (54.069 kg)  BMI 22.53 kg/m2  Constitutional:  Alert and oriented, No acute distress. HEENT: [place] AT, moist mucus membranes.  Trachea midline, no masses. Cardiovascular: No clubbing, cyanosis, or edema. Respiratory: Normal  respiratory effort, no increased work of breathing. GI: Abdomen is soft, nontender, nondistended, no abdominal masses GU: No CVA tenderness.  Skin: No rashes, bruises or suspicious lesions. Lymph: No cervical or inguinal adenopathy. Neurologic: Grossly intact, no focal deficits, moving all 4 extremities. Psychiatric: Normal mood and affect.  Laboratory Data: No results found for: WBC, HGB, HCT, MCV, PLT  No results found for: CREATININE  No results found for: PSA  No results found for: TESTOSTERONE  No results found for: HGBA1C  Urinalysis    Component Value Date/Time   COLORURINE YELLOW 11/05/2015 1023   COLORURINE YELLOW 02/26/2012 1039   APPEARANCEUR HAZY* 11/05/2015 1023   APPEARANCEUR HAZY 02/26/2012 1039   LABSPEC 1.010 11/05/2015 1023   LABSPEC 1.005 02/26/2012 1039   PHURINE 7.0 11/05/2015 1023   PHURINE 8.0 02/26/2012 1039   GLUCOSEU NEGATIVE 11/05/2015 1023   GLUCOSEU NEGATIVE 02/26/2012 1039   HGBUR 2+* 11/05/2015 1023   HGBUR 3+ 02/26/2012 [place] 11/05/2015 [place] 02/26/2012 [place] 11/05/2015 1023   KETONESUR NEGATIVE 02/26/2012 1039   PROTEINUR NEGATIVE 11/05/2015 1023   PROTEINUR TRACE 02/26/2012 1039   NITRITE POSITIVE* 11/05/2015 1023   NITRITE NEGATIVE 02/26/2012 1039   LEUKOCYTESUR 2+* 11/05/2015 1023   LEUKOCYTESUR 2+ 02/26/2012 1039    Pertinent Imaging: CLINICAL DATA: Left-sided pain for 2 weeks, history of kidney stones  EXAM: RENAL / URINARY TRACT ULTRASOUND COMPLETE  COMPARISON: KUB of 11/05/2015  FINDINGS: Right Kidney:  Length: 10.0 cm. No hydronephrosis is seen. A single 5 mm calculus is noted in the midportion.  Left Kidney:  Length: 11.2 cm. There is mild to moderate hydronephrosis present. Multiple kidney stones are present the largest of 11 mm in diameter.  Bladder:  The urinary bladder is unremarkable. Bilateral ureteral jets are present. There is  and echogenicity near the expected left UV junction which may represent a distal left ureteral calculus of 8 mm in diameter.  IMPRESSION: 1. Mild to moderate left hydronephrosis. 2. Kidney stones left more numerous than right. 3. Suspect distal left ureteral calculus of 8 mm in diameter near the left UV junction.  Assessment & Plan:   1. Nephrolithiasis 2. UTI I discussed the patient that a renal ultrasound is less than ideal way of diagnosing kidney stone. Based on the fact that she has had recurrent Klebsiella UTIs, suspect that she does have some degree of stone burden. I discussed the next step would be to obtain a CT stone protocol for surgical planning for her nephrolithiasis. Given her recurrent Klebsiella UTI, I think that it would be in her best interest to remove any remaining stone burden because of these recurrent UTI. She'll follow up after obtaining the CT scan.  Return for after CT scan.  Nickie Retort, MD  [facility] [address], [place], [place] [address] [phone]

## 2015-12-29 ENCOUNTER — Ambulatory Visit
Admission: RE | Admit: 2015-12-29 | Discharge: 2015-12-29 | Disposition: A | Discharge status: Home / Self Care | Payer: PPO | Source: Ambulatory Visit | Attending: Urology | Admitting: Urology

## 2015-12-29 DIAGNOSIS — N323 Diverticulum of bladder: Secondary | ICD-10-CM | POA: Diagnosis not present

## 2015-12-29 DIAGNOSIS — N200 Calculus of kidney: Secondary | ICD-10-CM | POA: Insufficient documentation

## 2015-12-29 DIAGNOSIS — K5730 Diverticulosis of large intestine without perforation or abscess without bleeding: Secondary | ICD-10-CM | POA: Diagnosis not present

## 2016-01-05 ENCOUNTER — Encounter: Payer: Self-pay | Admitting: Urology

## 2016-01-05 ENCOUNTER — Other Ambulatory Visit: Payer: Self-pay | Admitting: Radiology

## 2016-01-05 ENCOUNTER — Ambulatory Visit (INDEPENDENT_AMBULATORY_CARE_PROVIDER_SITE_OTHER): Payer: PPO | Admitting: Urology

## 2016-01-05 VITALS — BP 146/77 | HR 70 | Ht 61.0 in | Wt 119.0 lb

## 2016-01-05 DIAGNOSIS — N390 Urinary tract infection, site not specified: Secondary | ICD-10-CM

## 2016-01-05 DIAGNOSIS — N200 Calculus of kidney: Secondary | ICD-10-CM | POA: Diagnosis not present

## 2016-01-05 DIAGNOSIS — N323 Diverticulum of bladder: Secondary | ICD-10-CM | POA: Diagnosis not present

## 2016-01-05 NOTE — Progress Notes (Signed)
01/05/2016 10:20 AM   Mignon Pine Fayrene Helper [birth year] [id number]  Referring provider: Madelyn Brunner, MD [facility] [place], [place] [address]  Chief Complaint  Patient presents with  . Nephrolithiasis    Ctscan results    HPI: The patient is a 70 year old female presents today for evaluation of left-sided nephrolithiasis. She is also being currently treated for Klebsiella pneumoniae urinary tract infection. On a recent renal ultrasound showed bilateral nephrolithiasis with a 5 mm stone in the right kidney and multiple stones with the largest 11 mm in the left. She also has suspected left UVJ stone 8 mm in size.  She has no personal history of nephrolithiasis before this. She is currently asymptomatic. She is on Cipro for Klebsiella pneumonia UTI. This is the second of saline UTIs she's had since June 2017.  CT scan shows bilateral nephrolithiasis. Stones on the left are punctate. She was a 6 mm stone on the right. She also has a small bladder diverticulum.  PMH: Past Medical History:  Diagnosis Date  . Fibrocystic breast disease   . Hyperlipidemia   . Kidney stone   . Osteoporosis     Surgical History: Past Surgical History:  Procedure Laterality Date  . BREAST BIOPSY Left 1965  . COLONOSCOPY WITH PROPOFOL N/A 11/01/2015   Procedure: COLONOSCOPY WITH PROPOFOL;  Surgeon: Lollie Sails, MD;  Location: [facility] ENDOSCOPY;  Service: Endoscopy;  Laterality: N/A;    Home Medications:    Medication List       Accurate as of 01/05/16 10:20 AM. Always use your most recent med list.          acetaminophen 500 MG tablet Commonly known as:  TYLENOL Take 1 tablet (500 mg total) by mouth every 6 (six) hours as needed.   CALCIUM 500 PO Take 1,000 mg by mouth daily.   multivitamin capsule Take 1 capsule by mouth daily.   pravastatin 20 MG tablet Commonly known as:  PRAVACHOL Take 20 mg by mouth daily.       Allergies: No Known  Allergies  Family History: Family History  Problem Relation Age of Onset  . Breast cancer Neg Hx   . Kidney cancer Neg Hx   . Kidney disease Neg Hx   . Prostate cancer Neg Hx     Social History:  reports that she has never smoked. She has never used smokeless tobacco. She reports that she does not drink alcohol or use drugs.  ROS: UROLOGY Frequent Urination?: No Hard to postpone urination?: No Burning/pain with urination?: No Get up at night to urinate?: No Leakage of urine?: No Urine stream starts and stops?: No Trouble starting stream?: No Do you have to strain to urinate?: No Blood in urine?: No Urinary tract infection?: No Sexually transmitted disease?: No Injury to kidneys or bladder?: No Painful intercourse?: No Weak stream?: No Currently pregnant?: No Vaginal bleeding?: No Last menstrual period?: n  Gastrointestinal Nausea?: No Vomiting?: No Indigestion/heartburn?: No Diarrhea?: No Constipation?: No  Constitutional Fever: No Night sweats?: No Weight loss?: No Fatigue?: No  Skin Skin rash/lesions?: No Itching?: No  Eyes Blurred vision?: No Double vision?: No  Ears/Nose/Throat Sore throat?: No Sinus problems?: No  Hematologic/Lymphatic Swollen glands?: No Easy bruising?: No  Cardiovascular Leg swelling?: No Chest pain?: No  Respiratory Cough?: No Shortness of breath?: No  Endocrine Excessive thirst?: No  Musculoskeletal Back pain?: No Joint pain?: No  Neurological Headaches?: No Dizziness?: No  Psychologic Depression?: No Anxiety?: No  Physical Exam: BP (!) 146/77   Pulse 70   Ht 5\' 1"  (1.549 m)   Wt 119 lb (54 kg)   BMI 22.48 kg/m   Constitutional:  Alert and oriented, No acute distress. HEENT: [place] AT, moist mucus membranes.  Trachea midline, no masses. Cardiovascular: No clubbing, cyanosis, or edema. Respiratory: Normal respiratory effort, no increased work of breathing. GI: Abdomen is soft, nontender, nondistended,  no abdominal masses GU: No CVA tenderness.  Skin: No rashes, bruises or suspicious lesions. Lymph: No cervical or inguinal adenopathy. Neurologic: Grossly intact, no focal deficits, moving all 4 extremities. Psychiatric: Normal mood and affect.  Laboratory Data: No results found for: WBC, HGB, HCT, MCV, PLT  No results found for: CREATININE  No results found for: PSA  No results found for: TESTOSTERONE  No results found for: HGBA1C  Urinalysis    Component Value Date/Time   COLORURINE YELLOW 11/05/2015 1023   APPEARANCEUR HAZY (A) 11/05/2015 1023   APPEARANCEUR HAZY 02/26/2012 1039   LABSPEC 1.010 11/05/2015 1023   LABSPEC 1.005 02/26/2012 1039   PHURINE 7.0 11/05/2015 1023   GLUCOSEU NEGATIVE 11/05/2015 1023   GLUCOSEU NEGATIVE 02/26/2012 1039   HGBUR 2+ (A) 11/05/2015 1023   BILIRUBINUR NEGATIVE 11/05/2015 [place] 02/26/2012 1039   KETONESUR NEGATIVE 11/05/2015 1023   PROTEINUR NEGATIVE 11/05/2015 1023   NITRITE POSITIVE (A) 11/05/2015 1023   LEUKOCYTESUR 2+ (A) 11/05/2015 1023   LEUKOCYTESUR 2+ 02/26/2012 1039    Pertinent Imaging: CLINICAL DATA:  History of kidney stones, some low back pain EXAM: CT ABDOMEN AND PELVIS WITHOUT CONTRAST TECHNIQUE: Multidetector CT imaging of the abdomen and pelvis was performed following the standard protocol without IV contrast. COMPARISON:  KUB of 11/05/2015 and ultrasound of 11/17/2015 FINDINGS: The lung bases are clear. The heart is mildly enlarged. The liver is unremarkable in the unenhanced state. No calcified gallstones are seen within the contracted gallbladder. The pancreas is normal in size on this unenhanced study and no ductal dilatation is seen. The adrenal glands and spleen are unremarkable. The stomach is not well distended. There are bilateral renal calculi, the largest in the lower pole of the right kidney measuring 42mm in diameter. The proximal ureters are normal in caliber. No  hydronephrosis is seen. The abdominal aorta is normal in caliber. No adenopathy is noted. The distal ureters are normal in caliber and no distal ureteral calculus is seen. The urinary bladder is moderately urine distended, and there does appear to be a left lateral posterior urinary bladder diverticulum present. The uterus is normal in size. No adnexal lesion is seen. Multiple rectosigmoid colon diverticula are present with a few diverticula scattered throughout the more proximal colon as well. No diverticulitis is seen. The terminal ileum and the appendix are unremarkable. The lumbar vertebrae are in normal alignment with normal intervertebral disc spaces. Degenerative change is noted involving the facet joints of L5-S1. IMPRESSION: 1. Bilateral renal calculi, the largest in the lower pole of the right kidney measuring 6 mm in diameter. No hydronephrosis. No ureteral calculi. 2. Left posterolateral urinary bladder diverticulum. 3. Multiple rectosigmoid colon diverticula.  Assessment & Plan:    1. Nephrolithiasis 2. Recurrent UTI 3. Bladder diverticulum I discussed the patient possible etiologies of her recurrent Klebsiella UTI a be related both to her stone burden as well as her bladder diverticulum. Klebsiella is classically associated with nephrolithiasis. We discussed treatment options. She has a 6 mm stone in the right kidney. She also has some very small  stones in the left lower pole. We discussed addressing her stone burden with cystoscopy and bilateral ureteroscopy with laser lithotripsy. Our main objective will be to address the stone on the right side. If this goes well, we may address the very small stones in the left side but the these are not as likely be causing her recurrent UTIs due to the size. She understands the risk, benefits, indications of this procedure. She understands the risks include bleeding, infection, iatrogenic injury, need for repeat surgery, and placement of  ureteral stents. All questions were answered she is agreeable proceeding.  We also discussed her bladder diverticulum with possible source for her recurrent UTI. I feel this is less likely source in comparison to her stones due to its small size. She continues to have recurrent UTIs after she has rendered stone free, this may be worth addressing at that time.    Nickie Retort, MD  [facility] [address] [place], [place] [address] [phone]

## 2016-01-06 ENCOUNTER — Telehealth: Payer: Self-pay | Admitting: Radiology

## 2016-01-06 NOTE — Telephone Encounter (Signed)
Notified pt of surgery scheduled 02/15/16, pre-admit testing appt on 02/02/16 @9 :30 & to call day prior to surgery for arrival time to SDS. Pt voices understanding.

## 2016-01-07 LAB — CULTURE, URINE COMPREHENSIVE

## 2016-01-09 ENCOUNTER — Telehealth: Payer: Self-pay | Admitting: Radiology

## 2016-01-09 DIAGNOSIS — N390 Urinary tract infection, site not specified: Secondary | ICD-10-CM

## 2016-01-09 MED ORDER — CIPROFLOXACIN HCL 500 MG PO TABS: 500.0000 mg | ORAL_TABLET | Freq: Two times a day (BID) | ORAL | 0 refills | Status: DC

## 2016-01-09 NOTE — Telephone Encounter (Signed)
Pt verbalized understanding of +ucx & script at pharmacy. Requests copy of Dr Carlynn Purl last office note be left at front desk.

## 2016-01-09 NOTE — Telephone Encounter (Signed)
-----   Message from Nickie Retort, MD sent at 01/09/2016  1:00 PM EDT ----- Can we start patient on cipro 500 mg bid for a week and reculture her urine before surgery? thanks

## 2016-01-09 NOTE — Telephone Encounter (Signed)
Medication sent to pharmacy  

## 2016-01-09 NOTE — Telephone Encounter (Signed)
LMOM notifying pt of +ucx & script for Cipro sent to pharmacy.

## 2016-01-09 NOTE — Telephone Encounter (Signed)
Chelsea, If you can order these antibiotics & call the pt I will make sure she gets a repeat ucx at the pre-admit appt.  Thanks, Luisalberto Beegle

## 2016-01-12 ENCOUNTER — Other Ambulatory Visit: Payer: Self-pay | Admitting: Radiology

## 2016-01-12 DIAGNOSIS — N390 Urinary tract infection, site not specified: Secondary | ICD-10-CM

## 2016-01-12 DIAGNOSIS — N200 Calculus of kidney: Secondary | ICD-10-CM

## 2016-02-02 ENCOUNTER — Encounter
Admission: RE | Admit: 2016-02-02 | Discharge: 2016-02-02 | Disposition: A | Discharge status: Home / Self Care | Payer: PPO | Source: Ambulatory Visit | Attending: Urology | Admitting: Urology

## 2016-02-02 DIAGNOSIS — E785 Hyperlipidemia, unspecified: Secondary | ICD-10-CM | POA: Insufficient documentation

## 2016-02-02 DIAGNOSIS — Z01818 Encounter for other preprocedural examination: Secondary | ICD-10-CM | POA: Diagnosis not present

## 2016-02-02 DIAGNOSIS — Z01810 Encounter for preprocedural cardiovascular examination: Secondary | ICD-10-CM | POA: Diagnosis not present

## 2016-02-02 NOTE — Patient Instructions (Signed)
  Your procedure is scheduled FO:[id number] 13, 2017 (Wednesday) Report to Same Day Surgery 2nd floor Medical Mall To find out your arrival time please call [phone] between 1PM - 3PM on February 14, 2016 (Tuesday)  Remember: Instructions that are not followed completely may result in serious medical risk, up to and including death, or upon the discretion of your surgeon and anesthesiologist your surgery may need to be rescheduled.    _x___ 1. Do not eat food or drink liquids after midnight. No gum chewing or hard candies.     _x___ 2. No Alcohol for 24 hours before or after surgery.   _x_3. No Smoking for 24 prior to surgery.   ____  4. Bring all medications with you on the day of surgery if instructed.    __x__ 5. Notify your doctor if there is any change in your medical condition     (cold, fever, infections).     Do not wear jewelry, make-up, hairpins, clips or nail polish.  Do not wear lotions, powders, or perfumes. You may wear deodorant.  Do not shave 48 hours prior to surgery. Men may shave face and neck.  Do not bring valuables to the hospital.    [facility] is not responsible for any belongings or valuables.               Contacts, dentures or bridgework may not be worn into surgery.  Leave your suitcase in the car. After surgery it may be brought to your room.  For patients admitted to the hospital, discharge time is determined by your treatment team.   Patients discharged the day of surgery will not be allowed to drive home.    Please read over the following fact sheets that you were given:   [facility] Preparing for Surgery and or MRSA Information   ___ Take these medicines the morning of surgery with A SIP OF WATER:    1.   2.  3.  4.  5.  6.  ____ Fleet Enema (as directed)   ___ Use CHG Soap or sage wipes as directed on instruction sheet   ____ Use inhalers on the day of surgery and bring to hospital day of surgery  ____ Stop metformin 2 days  prior to surgery    ____ Take 1/2 of usual insulin dose the night before surgery and none on the morning of surgery.           _x___ Stop aspirin or coumadin, or plavix (NO ASPIRIN)  _x__ Stop Anti-inflammatories such as Advil, Aleve, Ibuprofen, Motrin, Naproxen,          Naprosyn, Goodies powders or aspirin products. Ok to take Tylenol.   ____ Stop supplements until after surgery.    ____ Bring C-Pap to the hospital.

## 2016-02-03 LAB — URINE CULTURE: Culture: NO GROWTH

## 2016-02-15 ENCOUNTER — Ambulatory Visit: Payer: PPO | Admitting: Anesthesiology

## 2016-02-15 ENCOUNTER — Ambulatory Visit
Admission: RE | Admit: 2016-02-15 | Discharge: 2016-02-15 | Disposition: A | Discharge status: Home / Self Care | Payer: PPO | Source: Ambulatory Visit | Attending: Urology | Admitting: Urology

## 2016-02-15 ENCOUNTER — Encounter: Payer: Self-pay | Admitting: *Deleted

## 2016-02-15 ENCOUNTER — Encounter
Admission: RE | Disposition: A | Discharge status: Home / Self Care | Payer: Self-pay | Source: Ambulatory Visit | Attending: Urology

## 2016-02-15 DIAGNOSIS — N6019 Diffuse cystic mastopathy of unspecified breast: Secondary | ICD-10-CM | POA: Insufficient documentation

## 2016-02-15 DIAGNOSIS — K5730 Diverticulosis of large intestine without perforation or abscess without bleeding: Secondary | ICD-10-CM | POA: Diagnosis not present

## 2016-02-15 DIAGNOSIS — Z87440 Personal history of urinary (tract) infections: Secondary | ICD-10-CM | POA: Diagnosis not present

## 2016-02-15 DIAGNOSIS — N390 Urinary tract infection, site not specified: Secondary | ICD-10-CM | POA: Insufficient documentation

## 2016-02-15 DIAGNOSIS — Z87442 Personal history of urinary calculi: Secondary | ICD-10-CM | POA: Diagnosis not present

## 2016-02-15 DIAGNOSIS — M810 Age-related osteoporosis without current pathological fracture: Secondary | ICD-10-CM | POA: Diagnosis not present

## 2016-02-15 DIAGNOSIS — E785 Hyperlipidemia, unspecified: Secondary | ICD-10-CM | POA: Diagnosis not present

## 2016-02-15 DIAGNOSIS — N200 Calculus of kidney: Secondary | ICD-10-CM | POA: Insufficient documentation

## 2016-02-15 DIAGNOSIS — B961 Klebsiella pneumoniae [K. pneumoniae] as the cause of diseases classified elsewhere: Secondary | ICD-10-CM | POA: Diagnosis not present

## 2016-02-15 DIAGNOSIS — N323 Diverticulum of bladder: Secondary | ICD-10-CM | POA: Insufficient documentation

## 2016-02-15 HISTORY — PX: CYSTOSCOPY W/ URETERAL STENT PLACEMENT: SHX1429

## 2016-02-15 SURGERY — CYSTOSCOPY, WITH RETROGRADE PYELOGRAM AND URETERAL STENT INSERTION
Anesthesia: General | Laterality: Right | Wound class: Clean Contaminated

## 2016-02-15 MED ORDER — ROCURONIUM BROMIDE 100 MG/10ML IV SOLN
INTRAVENOUS | Status: DC | PRN
  Administered 2016-02-15: 30 mg via INTRAVENOUS

## 2016-02-15 MED ORDER — HYDROCODONE-ACETAMINOPHEN 5-325 MG PO TABS: 1.0000 | ORAL_TABLET | ORAL | 0 refills | Status: DC | PRN

## 2016-02-15 MED ORDER — FENTANYL CITRATE (PF) 100 MCG/2ML IJ SOLN: 25.0000 ug | INTRAMUSCULAR | Status: DC | PRN

## 2016-02-15 MED ORDER — CEPHALEXIN 500 MG PO CAPS: 500.0000 mg | ORAL_CAPSULE | Freq: Three times a day (TID) | ORAL | 0 refills | Status: DC

## 2016-02-15 MED ORDER — FENTANYL CITRATE (PF) 100 MCG/2ML IJ SOLN
INTRAMUSCULAR | Status: DC | PRN
  Administered 2016-02-15 (×2): 50 ug via INTRAVENOUS

## 2016-02-15 MED ORDER — SUGAMMADEX SODIUM 500 MG/5ML IV SOLN
INTRAVENOUS | Status: DC | PRN
  Administered 2016-02-15: 100 mg via INTRAVENOUS

## 2016-02-15 MED ORDER — LABETALOL HCL 5 MG/ML IV SOLN
INTRAVENOUS | Status: DC | PRN
  Administered 2016-02-15: 5 mg via INTRAVENOUS

## 2016-02-15 MED ORDER — ONDANSETRON HCL 4 MG/2ML IJ SOLN
INTRAMUSCULAR | Status: DC | PRN
  Administered 2016-02-15: 4 mg via INTRAVENOUS

## 2016-02-15 MED ORDER — PROMETHAZINE HCL 25 MG/ML IJ SOLN: 6.2500 mg | INTRAMUSCULAR | Status: DC | PRN

## 2016-02-15 MED ORDER — LIDOCAINE HCL (CARDIAC) 20 MG/ML IV SOLN
INTRAVENOUS | Status: DC | PRN
  Administered 2016-02-15: 50 mg via INTRAVENOUS

## 2016-02-15 MED ORDER — DEXAMETHASONE SODIUM PHOSPHATE 10 MG/ML IJ SOLN
INTRAMUSCULAR | Status: DC | PRN
  Administered 2016-02-15: 10 mg via INTRAVENOUS

## 2016-02-15 MED ORDER — MEPERIDINE HCL 25 MG/ML IJ SOLN: 6.2500 mg | INTRAMUSCULAR | Status: DC | PRN

## 2016-02-15 MED ORDER — MIDAZOLAM HCL 2 MG/2ML IJ SOLN
INTRAMUSCULAR | Status: DC | PRN
  Administered 2016-02-15: 2 mg via INTRAVENOUS

## 2016-02-15 MED ORDER — OXYCODONE HCL 5 MG PO TABS
5.0000 mg | ORAL_TABLET | Freq: Once | ORAL | Status: DC | PRN
Start: 2016-02-15 — End: 2016-02-15

## 2016-02-15 MED ORDER — FAMOTIDINE 20 MG PO TABS
20.0000 mg | ORAL_TABLET | Freq: Once | ORAL | Status: AC
  Administered 2016-02-15: 20 mg via ORAL

## 2016-02-15 MED ORDER — PROPOFOL 10 MG/ML IV BOLUS
INTRAVENOUS | Status: DC | PRN
  Administered 2016-02-15: 130 mg via INTRAVENOUS
  Administered 2016-02-15: 70 mg via INTRAVENOUS

## 2016-02-15 MED ORDER — CEFAZOLIN SODIUM-DEXTROSE 2-4 GM/100ML-% IV SOLN
INTRAVENOUS | Status: AC
  Administered 2016-02-15: 2 g via INTRAVENOUS
  Filled 2016-02-15: qty 100

## 2016-02-15 MED ORDER — GENTAMICIN SULFATE 40 MG/ML IJ SOLN
INTRAMUSCULAR | Status: DC | PRN
  Administered 2016-02-15: 80 mg via INTRAVENOUS

## 2016-02-15 MED ORDER — OXYCODONE HCL 5 MG/5ML PO SOLN: 5.0000 mg | Freq: Once | ORAL | Status: DC | PRN

## 2016-02-15 MED ORDER — GENTAMICIN IN SALINE 1.6-0.9 MG/ML-% IV SOLN
80.0000 mg | Freq: Once | INTRAVENOUS | Status: DC
  Filled 2016-02-15: qty 50

## 2016-02-15 MED ORDER — LACTATED RINGERS IV SOLN
INTRAVENOUS | Status: DC
  Administered 2016-02-15: 12:00:00 via INTRAVENOUS

## 2016-02-15 MED ORDER — IOTHALAMATE MEGLUMINE 17.2 % UR SOLN
URETHRAL | Status: DC | PRN
Start: 2016-02-15 — End: 2016-02-15
  Administered 2016-02-15: 5 mL via URETHRAL

## 2016-02-15 MED ORDER — CLINDAMYCIN PHOSPHATE 900 MG/6ML IJ SOLN: INTRAMUSCULAR | Status: DC | PRN

## 2016-02-15 MED ORDER — CEFAZOLIN SODIUM-DEXTROSE 2-4 GM/100ML-% IV SOLN
2.0000 g | INTRAVENOUS | Status: AC
  Administered 2016-02-15: 2 g via INTRAVENOUS

## 2016-02-15 MED ORDER — FAMOTIDINE 20 MG PO TABS
ORAL_TABLET | ORAL | Status: AC
  Administered 2016-02-15: 20 mg via ORAL
  Filled 2016-02-15: qty 1

## 2016-02-15 SURGICAL SUPPLY — 28 items
BACTOSHIELD CHG 4% 4OZ (MISCELLANEOUS) ×1
BASKET ZERO TIP 1.9FR (BASKET) IMPLANT
CATH URETL 5X70 OPEN END (CATHETERS) ×3 IMPLANT
CNTNR SPEC 2.5X3XGRAD LEK (MISCELLANEOUS) ×2
CONT SPEC 4OZ STER OR WHT (MISCELLANEOUS) ×1
CONTAINER SPEC 2.5X3XGRAD LEK (MISCELLANEOUS) ×2 IMPLANT
FIBER LASER LITHO 273 (Laser) ×3 IMPLANT
GLOVE BIO SURGEON STRL SZ7 (GLOVE) ×3 IMPLANT
GLOVE BIO SURGEON STRL SZ7.5 (GLOVE) ×3 IMPLANT
GOWN STRL REUS W/ TWL LRG LVL4 (GOWN DISPOSABLE) ×2 IMPLANT
GOWN STRL REUS W/TWL LRG LVL4 (GOWN DISPOSABLE) ×1
GOWN STRL REUS W/TWL XL LVL3 (GOWN DISPOSABLE) ×3 IMPLANT
GUIDEWIRE SUPER STIFF (WIRE) IMPLANT
INTRODUCER DILATOR DOUBLE (INTRODUCER) ×3 IMPLANT
KIT RM TURNOVER CYSTO AR (KITS) ×3 IMPLANT
PACK CYSTO AR (MISCELLANEOUS) ×3 IMPLANT
SCRUB CHG 4% DYNA-HEX 4OZ (MISCELLANEOUS) ×2 IMPLANT
SENSORWIRE 0.038 NOT ANGLED (WIRE) ×3
SET CYSTO W/LG BORE CLAMP LF (SET/KITS/TRAYS/PACK) ×3 IMPLANT
SHEATH URETERAL 13/15X36 1L (SHEATH) ×3 IMPLANT
SOL .9 NS 3000ML IRR  AL (IV SOLUTION) ×1
SOL .9 NS 3000ML IRR UROMATIC (IV SOLUTION) ×2 IMPLANT
STENT URET 6FRX24 CONTOUR (STENTS) ×3 IMPLANT
STENT URET 6FRX26 CONTOUR (STENTS) IMPLANT
SURGILUBE 2OZ TUBE FLIPTOP (MISCELLANEOUS) ×3 IMPLANT
SYRINGE IRR TOOMEY STRL 70CC (SYRINGE) ×3 IMPLANT
WATER STERILE IRR 1000ML POUR (IV SOLUTION) ×3 IMPLANT
WIRE SENSOR 0.038 NOT ANGLED (WIRE) ×2 IMPLANT

## 2016-02-15 NOTE — Discharge Instructions (Addendum)
Cystoscopy, Care After  [place]  Things you can do to ease any discomfort after your procedure include:  Drinking enough water and fluids to keep your urine clear or pale yellow.  Taking a warm bath to relieve any burning feelings. SEEK IMMEDIATE MEDICAL CARE IF:   You have an increase in blood in your urine.  You notice blood clots in your urine.  You have difficulty passing urine.  You have the chills.  You have abdominal pain.  You have a fever or persistent symptoms for more than 2-3 days.  You have a fever and your symptoms suddenly get worse.     Ureteral Stent Implantation, Care After  WHAT TO EXPECT AFTER THE PROCEDURE You should be back to normal activity within 48 hours after the procedure. Nausea and vomiting may occur and are commonly the result of anesthesia. It is common to experience sharp pain in the back or lower abdomen and with voiding. This is caused by movement of the ends of the stent with the act of urinating.It usually goes away within minutes after you have stopped urinating. HOME CARE INSTRUCTIONS Make sure to drink plenty of fluids. You may have small amounts of bleeding, causing your urine to be red. This is normal. Certain movements may trigger pain or a feeling that you need to urinate. You may be given medicines to prevent infection or bladder spasms. Be sure to take all medicines as directed. Only take over-the-counter or prescription medicines for pain, discomfort, or fever as directed by your health care provider. Do not take aspirin, as this can make bleeding worse. Your stent will be left in until the blockage is resolved. This may take 2 weeks or longer, depending on the reason for stent implantation. You may have an X-ray exam to make sure your ureter is open and that the stent has not moved out of position (migrated). The stent can be removed by your health care provider in the office. Medicines may be given for comfort while the  stent is being removed. Be sure to keep all follow-up appointments so your health care provider can check that you are healing properly. SEEK MEDICAL CARE IF:  You experience increasing pain.  Your pain medicine is not working. SEEK IMMEDIATE MEDICAL CARE IF:  Your urine is dark red or has blood clots.  You are leaking urine (incontinent).  You have a fever, chills, feeling sick to your stomach (nausea), or vomiting.  Your pain is not relieved by pain medicine.  The end of the stent comes out of the urethra.  You are unable to urinate.    General Anesthesia, Adult, Care After Refer to this sheet in the next few weeks. These instructions provide you with information on caring for yourself after your procedure. Your health care provider may also give you more specific instructions. Your treatment has been planned according to current medical practices, but problems sometimes occur. Call your health care provider if you have any problems or questions after your procedure. WHAT TO EXPECT AFTER THE PROCEDURE After the procedure, it is typical to experience:  Sleepiness.  Nausea and vomiting. HOME CARE INSTRUCTIONS  For the first 24 hours after general anesthesia:  Have a responsible person with you.  Do not drive a car. If you are alone, do not take public transportation.  Do not drink alcohol.  Do not take medicine that has not been prescribed by your health care provider.  Do not sign important papers or make  important decisions.  You may resume a normal diet and activities as directed by your health care provider.  If you have questions or problems that seem related to general anesthesia, call the hospital and ask for the anesthetist or anesthesiologist on call. SEEK MEDICAL CARE IF:  You have nausea and vomiting that continue the day after anesthesia.  You develop a rash. SEEK IMMEDIATE MEDICAL CARE IF:   You have difficulty breathing.  You have chest  pain.  You have any allergic problems.   This information is not intended to replace advice given to you by your health care provider. Make sure you discuss any questions you have with your health care provider.   Document Released: 08/27/2000 Document Revised: 06/11/2014 Document Reviewed: 09/19/2011 Elsevier Interactive Patient Education [employer].

## 2016-02-15 NOTE — Op Note (Signed)
Date of procedure: 02/15/16  Preoperative diagnosis:  1. Recurrent UTI 2. Bilateral nephrolithiasis   Postoperative diagnosis:  1. Recurrent UTI 2. Bilateral nephrolithiasis   Procedure: 1. Cystoscopy 2. Right retrograde polygrams interpretation 3. Right ureteral stent placement 6 French by 24 cm  Surgeon: Baruch Gouty, MD  Anesthesia: General  Complications: None  Intraoperative findings: Upon placement of 2 sensor wires in anticipation of right flexible ureteroscopy, there was drainage of debris and concern for infected urine drainage. The incisions but was to place a stent to abort. Right retrograde pyelogram was used to identify the right collecting system. Vaginal atrophy also noted.  EBL: None  Specimens: Urine culture from right ureter  Drains: 6 French by 24 cm right double-J ureteral stent  Disposition: Stable to the postanesthesia care unit  Indication for procedure: The patient is a 70 y.o. female with history of recurrent UTI and bilateral nephrolithiasis with a significant stone burden in the right side presents today for stone removal in hopes that it'll clear her recurrent UTI.  After reviewing the management options for treatment, the patient elected to proceed with the above surgical procedure(s). We have discussed the potential benefits and risks of the procedure, side effects of the proposed treatment, the likelihood of the patient achieving the goals of the procedure, and any potential problems that might occur during the procedure or recuperation. Informed consent has been obtained.  Description of procedure: The patient was met in the preoperative area. All risks, benefits, and indications of the procedure were described in great detail. The patient consented to the procedure. Preoperative antibiotics were given. The patient was taken to the operative theater. General anesthesia was induced per the anesthesia service. The patient was then placed in the dorsal  lithotomy position and prepped and draped in the usual sterile fashion. A preoperative timeout was called.   21 French 30 cystoscope was inserted into the patient's bladder per urethra atraumatically. She did have vaginal atrophy noted at this time. The right ureter orifice was identified and intubated with a dual-lumen catheter 2 sensor wires were placed the level of the right renal pelvis under fluoroscopy. Upon placement of the 2 sensor wires there is drainage of cloudy debris filled urine is concern for infection. This urine was sent for culture. Decision was times abort ureteroscopy and place a stent to ensure that there was not an active infection. Retro-polygrams obtained at this point which showed the collecting system. This was under low pressure. A 6 French by 24 cm double-J ureteral stent was then placed over the sensor wire. Sensor was removed. A curl seen in the patient's urinary bladder under visualization in the renal pelvis on fluoroscopy indicating correct position. The patient's bladder was then drained. His wound from anesthesia and transferred stable condition post anesthesia care unit.  Plan: The patient will follow-up in a couple weeks after negative urine culture for repeat ureteroscopy.  Baruch Gouty, M.D.

## 2016-02-15 NOTE — Anesthesia Postprocedure Evaluation (Signed)
Anesthesia Post Note  Patient: Linda Gentry  Procedure(s) Performed: Procedure(s) (LRB): CYSTOSCOPY WITH RETROGRADE PYELOGRAM/URETERAL STENT PLACEMENT (Right)  Patient location during evaluation: PACU Anesthesia Type: General Level of consciousness: awake and alert and oriented Pain management: pain level controlled Vital Signs Assessment: post-procedure vital signs reviewed and stable Respiratory status: spontaneous breathing, nonlabored ventilation and respiratory function stable Cardiovascular status: blood pressure returned to baseline and stable Postop Assessment: no signs of nausea or vomiting Anesthetic complications: no    Last Vitals:  Vitals:   02/15/16 1345 02/15/16 1355  BP: (!) 172/91 (!) 170/88  Pulse:    Resp:    Temp:  36.3 C    Last Pain:  Vitals:   02/15/16 1050  TempSrc: Oral                 Eusebia Grulke

## 2016-02-15 NOTE — Anesthesia Preprocedure Evaluation (Signed)
Anesthesia Evaluation  Patient identified by MRN, date of birth, ID band Patient awake    Reviewed: Allergy & Precautions, NPO status , Patient's Chart, lab work & pertinent test results  History of Anesthesia Complications Negative for: history of anesthetic complications  Airway Mallampati: III  TM Distance: >3 FB Neck ROM: Full    Dental no notable dental hx.    Pulmonary neg pulmonary ROS, neg sleep apnea, neg COPD,    breath sounds clear to auscultation- rhonchi (-) wheezing      Cardiovascular Exercise Tolerance: Good (-) hypertension(-) CAD and (-) Past MI  Rhythm:Regular Rate:Normal - Systolic murmurs and - Diastolic murmurs    Neuro/Psych negative neurological ROS  negative psych ROS   GI/Hepatic negative GI ROS, Neg liver ROS,   Endo/Other  negative endocrine ROSneg diabetes  Renal/GU Renal disease: nephrolithiasis.     Musculoskeletal negative musculoskeletal ROS (+)   Abdominal (+) - obese,   Peds  Hematology negative hematology ROS (+)   Anesthesia Other Findings Past Medical History: No date: Fibrocystic breast disease No date: Hyperlipidemia No date: Kidney stone No date: Osteoporosis   Reproductive/Obstetrics                             Anesthesia Physical Anesthesia Plan  ASA: II  Anesthesia Plan: General   Post-op Pain Management:    Induction: Intravenous  Airway Management Planned: Oral ETT  Additional Equipment:   Intra-op Plan:   Post-operative Plan: Extubation in OR  Informed Consent: I have reviewed the patients History and Physical, chart, labs and discussed the procedure including the risks, benefits and alternatives for the proposed anesthesia with the patient or authorized representative who has indicated his/her understanding and acceptance.   Dental advisory given  Plan Discussed with: CRNA and Anesthesiologist  Anesthesia Plan Comments:          Anesthesia Quick Evaluation

## 2016-02-15 NOTE — Anesthesia Procedure Notes (Signed)
Procedure Name: Intubation Performed by: Rolla Plate Pre-anesthesia Checklist: Patient identified, Patient being monitored, Timeout performed, Emergency Drugs available and Suction available Patient Re-evaluated:Patient Re-evaluated prior to inductionOxygen Delivery Method: Circle system utilized Preoxygenation: Pre-oxygenation with 100% oxygen Intubation Type: IV induction Ventilation: Mask ventilation without difficulty Laryngoscope Size: 3 and McGraph Grade View: Grade III Tube type: Oral Tube size: 7.0 mm Number of attempts: 3 Airway Equipment and Method: Stylet and Video-laryngoscopy Placement Confirmation: ETT inserted through vocal cords under direct vision,  positive ETCO2 and breath sounds checked- equal and bilateral Secured at: 20 cm Tube secured with: Tape Dental Injury: Teeth and Oropharynx as per pre-operative assessment  Difficulty Due To: Difficulty was unanticipated, Difficult Airway- due to reduced neck mobility and Difficult Airway- due to anterior larynx Future Recommendations: Recommend- induction with short-acting agent, and alternative techniques readily available

## 2016-02-15 NOTE — H&P (Signed)
Linda Gentry Jan 29, 1946 [id number]  Referring provider: Madelyn Brunner, MD [facility] [address], [place] [address]      Chief Complaint  Patient presents with  . Nephrolithiasis    Ctscan results    HPI: The patient is a 70 year old female presents today for evaluation of left-sided nephrolithiasis. She is also being currently treated for Klebsiella pneumoniae urinary tract infection. On a recent renal ultrasound showed bilateral nephrolithiasis with a 5 mm stone in the right kidney and multiple stones with the largest 11 mm in the left. She also has suspected left UVJ stone 8 mm in size.  She has no personal history of nephrolithiasis before this. She is currently asymptomatic. She is on Cipro for Klebsiella pneumonia UTI. This is the second of saline UTIs she's had since June 2017.  CT scan shows bilateral nephrolithiasis. Stones on the left are punctate. She was a 6 mm stone on the right. She also has a small bladder diverticulum.  PMH:     Past Medical History:  Diagnosis Date  . Fibrocystic breast disease   . Hyperlipidemia   . Kidney stone   . Osteoporosis     Surgical History:      Past Surgical History:  Procedure Laterality Date  . BREAST BIOPSY Left 1965  . COLONOSCOPY WITH PROPOFOL N/A 11/01/2015   Procedure: COLONOSCOPY WITH PROPOFOL;  Surgeon: Lollie Sails, MD;  Location: [facility] ENDOSCOPY;  Service: Endoscopy;  Laterality: N/A;    Home Medications:        Medication List           Accurate as of 01/05/16 10:20 AM. Always use your most recent med list.           acetaminophen 500 MG tablet Commonly known as:  TYLENOL Take 1 tablet (500 mg total) by mouth every 6 (six) hours as needed.  CALCIUM 500 PO Take 1,000 mg by mouth daily.  multivitamin capsule Take 1 capsule by mouth daily.  pravastatin 20 MG tablet Commonly known as:  PRAVACHOL Take 20 mg by mouth daily.       Allergies: No Known Allergies  Family History:      Family History  Problem Relation Age of Onset  . Breast cancer Neg Hx   . Kidney cancer Neg Hx   . Kidney disease Neg Hx   . Prostate cancer Neg Hx     Social History:  reports that she has never smoked. She has never used smokeless tobacco. She reports that she does not drink alcohol or use drugs.  ROS: UROLOGY Frequent Urination?: No Hard to postpone urination?: No Burning/pain with urination?: No Get up at night to urinate?: No Leakage of urine?: No Urine stream starts and stops?: No Trouble starting stream?: No Do you have to strain to urinate?: No Blood in urine?: No Urinary tract infection?: No Sexually transmitted disease?: No Injury to kidneys or bladder?: No Painful intercourse?: No Weak stream?: No Currently pregnant?: No Vaginal bleeding?: No Last menstrual period?: n  Gastrointestinal Nausea?: No Vomiting?: No Indigestion/heartburn?: No Diarrhea?: No Constipation?: No  Constitutional Fever: No Night sweats?: No Weight loss?: No Fatigue?: No  Skin Skin rash/lesions?: No Itching?: No  Eyes Blurred vision?: No Double vision?: No  Ears/Nose/Throat Sore throat?: No Sinus problems?: No  Hematologic/Lymphatic Swollen glands?: No Easy bruising?: No  Cardiovascular Leg swelling?: No Chest pain?: No  Respiratory Cough?: No Shortness of breath?: No  Endocrine Excessive thirst?: No  Musculoskeletal  Back pain?: No Joint pain?: No  Neurological Headaches?: No Dizziness?: No  Psychologic Depression?: No Anxiety?: No  Physical Exam: BP (!) 146/77   Pulse 70   Ht 5\' 1"  (1.549 m)   Wt 119 lb (54 kg)   BMI 22.48 kg/m   Constitutional:  Alert and oriented, No acute distress. HEENT: [place] AT, moist mucus membranes.  Trachea midline, no masses. Cardiovascular: No clubbing, cyanosis, or edema. RRR Respiratory: Normal respiratory effort, no increased work  of breathing. clear GI: Abdomen is soft, nontender, nondistended, no abdominal masses GU: No CVA tenderness.  Skin: No rashes, bruises or suspicious lesions. Lymph: No cervical or inguinal adenopathy. Neurologic: Grossly intact, no focal deficits, moving all 4 extremities. Psychiatric: Normal mood and affect.  Laboratory Data: RecentLabs  No results found for: WBC, HGB, HCT, MCV, PLT    RecentLabs  No results found for: CREATININE    RecentLabs  No results found for: PSA    RecentLabs  No results found for: TESTOSTERONE    RecentLabs  No results found for: HGBA1C    Urinalysis Labs(Brief)          Component Value Date/Time   COLORURINE YELLOW 11/05/2015 1023   APPEARANCEUR HAZY (A) 11/05/2015 1023   APPEARANCEUR HAZY 02/26/2012 1039   LABSPEC 1.010 11/05/2015 1023   LABSPEC 1.005 02/26/2012 1039   PHURINE 7.0 11/05/2015 1023   GLUCOSEU NEGATIVE 11/05/2015 1023   GLUCOSEU NEGATIVE 02/26/2012 1039   HGBUR 2+ (A) 11/05/2015 1023   BILIRUBINUR NEGATIVE 11/05/2015 [place] 02/26/2012 1039   KETONESUR NEGATIVE 11/05/2015 1023   PROTEINUR NEGATIVE 11/05/2015 1023   NITRITE POSITIVE (A) 11/05/2015 1023   LEUKOCYTESUR 2+ (A) 11/05/2015 1023   LEUKOCYTESUR 2+ 02/26/2012 1039      Pertinent Imaging: CLINICAL DATA: History of kidney stones, some low back pain EXAM: CT ABDOMEN AND PELVIS WITHOUT CONTRAST TECHNIQUE: Multidetector CT imaging of the abdomen and pelvis was performed following the standard protocol without IV contrast. COMPARISON: KUB of 11/05/2015 and ultrasound of 11/17/2015 FINDINGS: The lung bases are clear. The heart is mildly enlarged. The liver is unremarkable in the unenhanced state. No calcified gallstones are seen within the contracted gallbladder. The pancreas is normal in size on this unenhanced study and no ductal dilatation is seen. The adrenal glands and spleen are unremarkable. The  stomach is not well distended. There are bilateral renal calculi, the largest in the lower pole of the right kidney measuring 81mm in diameter. The proximal ureters are normal in caliber. No hydronephrosis is seen. The abdominal aorta is normal in caliber. No adenopathy is noted. The distal ureters are normal in caliber and no distal ureteral calculus is seen. The urinary bladder is moderately urine distended, and there does appear to be a left lateral posterior urinary bladder diverticulum present. The uterus is normal in size. No adnexal lesion is seen. Multiple rectosigmoid colon diverticula are present with a few diverticula scattered throughout the more proximal colon as well. No diverticulitis is seen. The terminal ileum and the appendix are unremarkable. The lumbar vertebrae are in normal alignment with normal intervertebral disc spaces. Degenerative change is noted involving the facet joints of L5-S1. IMPRESSION: 1. Bilateral renal calculi, the largest in the lower pole of the right kidney measuring 6 mm in diameter. No hydronephrosis. No ureteral calculi. 2. Left posterolateral urinary bladder diverticulum. 3. Multiple rectosigmoid colon diverticula.  Assessment & Plan:    1. Nephrolithiasis 2. Recurrent UTI 3. Bladder diverticulum I discussed the  patient possible etiologies of her recurrent Klebsiella UTI a be related both to her stone burden as well as her bladder diverticulum. Klebsiella is classically associated with nephrolithiasis. We discussed treatment options. She has a 6 mm stone in the right kidney. She also has some very small stones in the left lower pole. We discussed addressing her stone burden with cystoscopy and bilateral ureteroscopy with laser lithotripsy. Our main objective will be to address the stone on the right side. If this goes well, we may address the very small stones in the left side but the these are not as likely be causing her recurrent UTIs due  to the size. She understands the risk, benefits, indications of this procedure. She understands the risks include bleeding, infection, iatrogenic injury, need for repeat surgery, and placement of ureteral stents. All questions were answered she is agreeable proceeding.  We also discussed her bladder diverticulum with possible source for her recurrent UTI. I feel this is less likely source in comparison to her stones due to its small size. She continues to have recurrent UTIs after she has rendered stone free, this may be worth addressing at that time.    Nickie Retort, MD  [facility] [address] [place], [place] [address] [phone]

## 2016-02-15 NOTE — Transfer of Care (Signed)
Immediate Anesthesia Transfer of Care Note  Patient: Linda Gentry  Procedure(s) Performed: Procedure(s): CYSTOSCOPY WITH RETROGRADE PYELOGRAM/URETERAL STENT PLACEMENT (Right)  Patient Location: PACU  Anesthesia Type:General  Level of Consciousness: awake, alert , oriented and patient cooperative  Airway & Oxygen Therapy: Patient Spontanous Breathing and Patient connected to face mask oxygen  Post-op Assessment: Report given to RN, Post -op Vital signs reviewed and stable and Patient moving all extremities X 4  Post vital signs: Reviewed and stable  Last Vitals:  Vitals:   02/15/16 1050 02/15/16 1323  BP: (!) 164/94 (!) 172/99  Pulse: 78 84  Resp: 16 (!) 21  Temp: 36.7 C 36.5 C    Last Pain:  Vitals:   02/15/16 1050  TempSrc: Oral         Complications: No apparent anesthesia complications

## 2016-02-16 LAB — URINE CULTURE
Culture: <10000 — AB
Special Requests: NORMAL

## 2016-02-17 ENCOUNTER — Telehealth: Payer: Self-pay | Admitting: Radiology

## 2016-02-17 NOTE — Telephone Encounter (Signed)
Spoke with pt regarding repeat URS planned with Dr Pilar Jarvis. Pt requests surgery date of 03/14/16 due to work conflicts. Pt requests pre-admit testing phone interview prior to this surgery. Will arrange.

## 2016-02-17 NOTE — Telephone Encounter (Signed)
-----   Message from Nickie Retort, MD sent at 02/17/2016 12:24 PM EDT ----- Regarding: RE: r/s surgery If the patient is tolerating the stent, she can wait for my next available.    ----- Message ----- From: Ranell Patrick, RN Sent: 02/17/2016   9:43 AM To: Nickie Retort, MD Subject: r/s surgery                                    You asked to r/s this pt for URS in 2 wks but your next available is 10/4 (in 3 wks) or should we ask Dr Erlene Quan to do it on 9/25 or 9/27?

## 2016-02-17 NOTE — Telephone Encounter (Signed)
Notified pt of surgery scheduled with Dr Pilar Jarvis on 03/14/16, pre-admit phone interview on 03/05/16 between 9am-1pm, appt at [facility] for urine culture on 03/05/16 @1 :30 & to call day prior to surgery for arrival time to [facility]. Pt voices understanding.

## 2016-02-23 ENCOUNTER — Other Ambulatory Visit: Payer: Self-pay | Admitting: Radiology

## 2016-02-23 ENCOUNTER — Ambulatory Visit: Payer: PPO

## 2016-02-23 DIAGNOSIS — N200 Calculus of kidney: Secondary | ICD-10-CM

## 2016-02-23 DIAGNOSIS — H40013 Open angle with borderline findings, low risk, bilateral: Secondary | ICD-10-CM | POA: Diagnosis not present

## 2016-02-23 DIAGNOSIS — H2513 Age-related nuclear cataract, bilateral: Secondary | ICD-10-CM | POA: Diagnosis not present

## 2016-03-01 ENCOUNTER — Other Ambulatory Visit: Payer: PPO

## 2016-03-02 ENCOUNTER — Other Ambulatory Visit: Payer: Self-pay

## 2016-03-02 DIAGNOSIS — N200 Calculus of kidney: Secondary | ICD-10-CM

## 2016-03-05 ENCOUNTER — Encounter
Admission: RE | Admit: 2016-03-05 | Discharge: 2016-03-05 | Disposition: A | Discharge status: Home / Self Care | Payer: PPO | Source: Ambulatory Visit | Attending: Urology | Admitting: Urology

## 2016-03-05 ENCOUNTER — Other Ambulatory Visit: Payer: PPO

## 2016-03-05 DIAGNOSIS — N200 Calculus of kidney: Secondary | ICD-10-CM | POA: Diagnosis not present

## 2016-03-05 LAB — MICROSCOPIC EXAMINATION: RBC, UA: >30 /hpf — AB (ref 0–?)

## 2016-03-05 LAB — URINALYSIS, COMPLETE
Bilirubin, UA: NEGATIVE
Glucose, UA: NEGATIVE
Ketones, UA: NEGATIVE
Nitrite, UA: NEGATIVE
PH UA: 5.5 (ref 5.0–7.5)
Specific Gravity, UA: 1.015 (ref 1.005–1.030)
Urobilinogen, Ur: 0.2 mg/dL (ref 0.2–1.0)

## 2016-03-05 NOTE — Patient Instructions (Signed)
  Your procedure is scheduled on: 03-14-16 Report to Same Day Surgery 2nd floor medical mall To find out your arrival time please call [phone] between 1PM - 3PM on 03-13-16  Remember: Instructions that are not followed completely may result in serious medical risk, up to and including death, or upon the discretion of your surgeon and anesthesiologist your surgery may need to be rescheduled.    _x___ 1. Do not eat food or drink liquids after midnight. No gum chewing or hard candies.     __x__ 2. No Alcohol for 24 hours before or after surgery.   __x__3. No Smoking for 24 prior to surgery.   ____  4. Bring all medications with you on the day of surgery if instructed.    __x__ 5. Notify your doctor if there is any change in your medical condition     (cold, fever, infections).     Do not wear jewelry, make-up, hairpins, clips or nail polish.  Do not wear lotions, powders, or perfumes. You may wear deodorant.  Do not shave 48 hours prior to surgery. Men may shave face and neck.  Do not bring valuables to the hospital.    [facility] is not responsible for any belongings or valuables.               Contacts, dentures or bridgework may not be worn into surgery.  Leave your suitcase in the car. After surgery it may be brought to your room.  For patients admitted to the hospital, discharge time is determined by your treatment team.   Patients discharged the day of surgery will not be allowed to drive home.    Please read over the following fact sheets that you were given:   [facility] Preparing for Surgery and or MRSA Information   ____ Take these medicines the morning of surgery with A SIP OF WATER:    1. NONE  2.  3.  4.  5.  6.  ____Fleets enema or Magnesium Citrate as directed.   ____ Use CHG Soap or sage wipes as directed on instruction sheet   ____ Use inhalers on the day of surgery and bring to hospital day of surgery  ____ Stop metformin 2 days prior to  surgery    ____ Take 1/2 of usual insulin dose the night before surgery and none on the morning of surgery.   ____ Stop aspirin or coumadin, or plavix  x__ Stop Anti-inflammatories such as Advil, Aleve, Ibuprofen, Motrin, Naproxen,          Naprosyn, Goodies powders or aspirin products. Ok to take Tylenol OR HYDROCODONE    ____ Stop supplements until after surgery.    ____ Bring C-Pap to the hospital.

## 2016-03-08 ENCOUNTER — Telehealth: Payer: Self-pay | Admitting: Urology

## 2016-03-08 LAB — CULTURE, URINE COMPREHENSIVE

## 2016-03-08 NOTE — Telephone Encounter (Signed)
Patient wants her labs results prior to her surgery on Wednesday   Linda Gentry

## 2016-03-12 NOTE — Telephone Encounter (Signed)
Spoke with pt in reference to lab results.

## 2016-03-14 ENCOUNTER — Ambulatory Visit: Payer: PPO | Admitting: Registered Nurse

## 2016-03-14 ENCOUNTER — Telehealth: Payer: Self-pay

## 2016-03-14 ENCOUNTER — Ambulatory Visit
Admission: RE | Admit: 2016-03-14 | Discharge: 2016-03-14 | Disposition: A | Discharge status: Home / Self Care | Payer: PPO | Source: Ambulatory Visit | Attending: Urology | Admitting: Urology

## 2016-03-14 ENCOUNTER — Telehealth: Payer: Self-pay | Admitting: Urology

## 2016-03-14 ENCOUNTER — Encounter
Admission: RE | Disposition: A | Discharge status: Home / Self Care | Payer: Self-pay | Source: Ambulatory Visit | Attending: Urology

## 2016-03-14 ENCOUNTER — Encounter: Payer: Self-pay | Admitting: *Deleted

## 2016-03-14 DIAGNOSIS — E785 Hyperlipidemia, unspecified: Secondary | ICD-10-CM | POA: Diagnosis not present

## 2016-03-14 DIAGNOSIS — B961 Klebsiella pneumoniae [K. pneumoniae] as the cause of diseases classified elsewhere: Secondary | ICD-10-CM | POA: Insufficient documentation

## 2016-03-14 DIAGNOSIS — N200 Calculus of kidney: Secondary | ICD-10-CM

## 2016-03-14 DIAGNOSIS — N390 Urinary tract infection, site not specified: Secondary | ICD-10-CM | POA: Diagnosis not present

## 2016-03-14 DIAGNOSIS — Z87442 Personal history of urinary calculi: Secondary | ICD-10-CM | POA: Insufficient documentation

## 2016-03-14 DIAGNOSIS — M810 Age-related osteoporosis without current pathological fracture: Secondary | ICD-10-CM | POA: Insufficient documentation

## 2016-03-14 DIAGNOSIS — N132 Hydronephrosis with renal and ureteral calculous obstruction: Secondary | ICD-10-CM | POA: Insufficient documentation

## 2016-03-14 DIAGNOSIS — Z87440 Personal history of urinary (tract) infections: Secondary | ICD-10-CM | POA: Diagnosis not present

## 2016-03-14 DIAGNOSIS — N323 Diverticulum of bladder: Secondary | ICD-10-CM | POA: Diagnosis not present

## 2016-03-14 HISTORY — PX: URETEROSCOPY WITH HOLMIUM LASER LITHOTRIPSY: SHX6645

## 2016-03-14 HISTORY — PX: CYSTOSCOPY W/ URETERAL STENT PLACEMENT: SHX1429

## 2016-03-14 SURGERY — URETEROSCOPY, WITH LITHOTRIPSY USING HOLMIUM LASER
Anesthesia: General | Laterality: Right | Wound class: Clean Contaminated

## 2016-03-14 MED ORDER — LACTATED RINGERS IV SOLN
INTRAVENOUS | Status: DC | PRN
  Administered 2016-03-14: 14:00:00 via INTRAVENOUS

## 2016-03-14 MED ORDER — CEFAZOLIN SODIUM-DEXTROSE 2-4 GM/100ML-% IV SOLN
INTRAVENOUS | Status: AC
  Filled 2016-03-14: qty 100

## 2016-03-14 MED ORDER — FAMOTIDINE 20 MG PO TABS
ORAL_TABLET | ORAL | Status: AC
  Administered 2016-03-14: 20 mg via ORAL
  Filled 2016-03-14: qty 1

## 2016-03-14 MED ORDER — FAMOTIDINE 20 MG PO TABS
20.0000 mg | ORAL_TABLET | Freq: Once | ORAL | Status: AC
  Administered 2016-03-14: 20 mg via ORAL

## 2016-03-14 MED ORDER — ROCURONIUM BROMIDE 100 MG/10ML IV SOLN
INTRAVENOUS | Status: DC | PRN
  Administered 2016-03-14: 35 mg via INTRAVENOUS

## 2016-03-14 MED ORDER — SUGAMMADEX SODIUM 200 MG/2ML IV SOLN
INTRAVENOUS | Status: DC | PRN
  Administered 2016-03-14: 200 mg via INTRAVENOUS

## 2016-03-14 MED ORDER — LIDOCAINE HCL (CARDIAC) 20 MG/ML IV SOLN
INTRAVENOUS | Status: DC | PRN
  Administered 2016-03-14: 10 mg via INTRAVENOUS
  Administered 2016-03-14: 40 mg via INTRAVENOUS

## 2016-03-14 MED ORDER — PROPOFOL 10 MG/ML IV BOLUS
INTRAVENOUS | Status: DC | PRN
  Administered 2016-03-14: 175 mg via INTRAVENOUS
  Administered 2016-03-14: 25 mg via INTRAVENOUS

## 2016-03-14 MED ORDER — LACTATED RINGERS IV SOLN
INTRAVENOUS | Status: DC
  Administered 2016-03-14: 13:00:00 via INTRAVENOUS

## 2016-03-14 MED ORDER — FENTANYL CITRATE (PF) 100 MCG/2ML IJ SOLN
INTRAMUSCULAR | Status: DC | PRN
  Administered 2016-03-14 (×2): 50 ug via INTRAVENOUS

## 2016-03-14 MED ORDER — CEPHALEXIN 500 MG PO CAPS: 500.0000 mg | ORAL_CAPSULE | Freq: Three times a day (TID) | ORAL | 0 refills | Status: DC

## 2016-03-14 MED ORDER — ONDANSETRON HCL 4 MG/2ML IJ SOLN: 4.0000 mg | Freq: Once | INTRAMUSCULAR | Status: DC | PRN

## 2016-03-14 MED ORDER — DEXAMETHASONE SODIUM PHOSPHATE 10 MG/ML IJ SOLN
INTRAMUSCULAR | Status: DC | PRN
  Administered 2016-03-14: 8 mg via INTRAVENOUS

## 2016-03-14 MED ORDER — ONDANSETRON HCL 4 MG/2ML IJ SOLN
INTRAMUSCULAR | Status: DC | PRN
  Administered 2016-03-14: 4 mg via INTRAVENOUS

## 2016-03-14 MED ORDER — HYDROCODONE-ACETAMINOPHEN 5-325 MG PO TABS: 1.0000 | ORAL_TABLET | ORAL | 0 refills | Status: DC | PRN

## 2016-03-14 MED ORDER — CEFAZOLIN SODIUM-DEXTROSE 2-4 GM/100ML-% IV SOLN
2.0000 g | INTRAVENOUS | Status: AC
  Administered 2016-03-14: 2 g via INTRAVENOUS

## 2016-03-14 MED ORDER — FENTANYL CITRATE (PF) 100 MCG/2ML IJ SOLN: 25.0000 ug | INTRAMUSCULAR | Status: DC | PRN

## 2016-03-14 SURGICAL SUPPLY — 30 items
ADHESIVE MASTISOL STRL (MISCELLANEOUS) ×4 IMPLANT
BACTOSHIELD CHG 4% 4OZ (MISCELLANEOUS) ×2
BASKET ZERO TIP 1.9FR (BASKET) ×4 IMPLANT
CATH URETL 5X70 OPEN END (CATHETERS) ×4 IMPLANT
CNTNR SPEC 2.5X3XGRAD LEK (MISCELLANEOUS) ×2
CONT SPEC 4OZ STER OR WHT (MISCELLANEOUS) ×2
CONTAINER SPEC 2.5X3XGRAD LEK (MISCELLANEOUS) ×2 IMPLANT
DRSG TEGADERM 2-3/8X2-3/4 SM (GAUZE/BANDAGES/DRESSINGS) ×4 IMPLANT
FIBER LASER LITHO 273 (Laser) ×4 IMPLANT
GLOVE BIO SURGEON STRL SZ7 (GLOVE) ×4 IMPLANT
GLOVE BIO SURGEON STRL SZ7.5 (GLOVE) ×4 IMPLANT
GOWN STRL REUS W/ TWL LRG LVL4 (GOWN DISPOSABLE) ×2 IMPLANT
GOWN STRL REUS W/TWL LRG LVL4 (GOWN DISPOSABLE) ×2
GOWN STRL REUS W/TWL XL LVL3 (GOWN DISPOSABLE) ×4 IMPLANT
GUIDEWIRE SUPER STIFF (WIRE) IMPLANT
INTRODUCER DILATOR DOUBLE (INTRODUCER) ×4 IMPLANT
KIT RM TURNOVER CYSTO AR (KITS) ×4 IMPLANT
PACK CYSTO AR (MISCELLANEOUS) ×4 IMPLANT
SCRUB CHG 4% DYNA-HEX 4OZ (MISCELLANEOUS) ×2 IMPLANT
SENSORWIRE 0.038 NOT ANGLED (WIRE) ×8
SET CYSTO W/LG BORE CLAMP LF (SET/KITS/TRAYS/PACK) ×4 IMPLANT
SHEATH URETERAL 13/15X36 1L (SHEATH) ×4 IMPLANT
SOL .9 NS 3000ML IRR  AL (IV SOLUTION) ×2
SOL .9 NS 3000ML IRR UROMATIC (IV SOLUTION) ×2 IMPLANT
STENT URET 6FRX24 CONTOUR (STENTS) ×4 IMPLANT
STENT URET 6FRX26 CONTOUR (STENTS) IMPLANT
SURGILUBE 2OZ TUBE FLIPTOP (MISCELLANEOUS) ×4 IMPLANT
SYRINGE IRR TOOMEY STRL 70CC (SYRINGE) ×4 IMPLANT
WATER STERILE IRR 1000ML POUR (IV SOLUTION) ×4 IMPLANT
WIRE SENSOR 0.038 NOT ANGLED (WIRE) ×4 IMPLANT

## 2016-03-14 NOTE — Telephone Encounter (Signed)
[facility] called to schedule f/u for pt and told us a renal U/S should be scheduled.  I didn't see an order in her chart.  Just F.Y.I.

## 2016-03-14 NOTE — Discharge Instructions (Signed)

## 2016-03-14 NOTE — Anesthesia Procedure Notes (Signed)
Procedure Name: Intubation Date/Time: 03/14/2016 1:52 PM Performed by: Darlyne Russian Pre-anesthesia Checklist: Patient identified, Emergency Drugs available, Suction available and Patient being monitored Patient Re-evaluated:Patient Re-evaluated prior to inductionOxygen Delivery Method: Circle system utilized Preoxygenation: Pre-oxygenation with 100% oxygen Intubation Type: IV induction Ventilation: Mask ventilation without difficulty Laryngoscope Size: Mac and 3 Grade View: Grade III Tube type: Oral Number of attempts: 1 Airway Equipment and Method: Stylet Placement Confirmation: ETT inserted through vocal cords under direct vision,  positive ETCO2 and breath sounds checked- equal and bilateral Secured at: 21 cm Tube secured with: Tape Dental Injury: Teeth and Oropharynx as per pre-operative assessment

## 2016-03-14 NOTE — Op Note (Signed)
Date of procedure: 03/14/16  Preoperative diagnosis:  1. Recurrent urinary tract infection 2. Nephrolithiasis   Postoperative diagnosis:  1. Recurrent urinary tract infection 2. Nephrolithiasis   Procedure: 1. Cystoscopy 2. Right retrograde pyelogram with interpretation 3. Right ureteroscopy 4. Laser lithotripsy 5. Stone basketing 6. Right 6 French by 24 cm double-J ureteral stent exchange with string  Surgeon: Baruch Gouty, MD  Anesthesia: General  Complications: None  Intraoperative findings: There was a 6 mm stone in the right mid pole that was broken into small fragments and all fragment and remove the stone basket. Pan nephroscopy and right retrograde pyelogram at end of the procedure showed no further stone burden.  EBL: None  Specimens: Right ureteral stone to pathology  Drains: 6 French by 24 cm right double-J ureteral stent with string  Disposition: Stable to the postanesthesia care unit  Indication for procedure: The patient is a 70 y.o. female with recurrent urinary tract infections who is a 6 mm stone in the right renal pelvis. She also has punctate stones on left side that were determined not likely the source for recurrent urinary tract infection and the decision was made to not attempt to remove the stones at this time she presents today for right renal stone removal. She has a right ureteral stent was placed under his debris draining from her right ureter that is concerning for infection and her last procedure.  After reviewing the management options for treatment, the patient elected to proceed with the above surgical procedure(s). We have discussed the potential benefits and risks of the procedure, side effects of the proposed treatment, the likelihood of the patient achieving the goals of the procedure, and any potential problems that might occur during the procedure or recuperation. Informed consent has been obtained.  Description of procedure: The patient  was met in the preoperative area. All risks, benefits, and indications of the procedure were described in great detail. The patient consented to the procedure. Preoperative antibiotics were given. The patient was taken to the operative theater. General anesthesia was induced per the anesthesia service. The patient was then placed in the dorsal lithotomy position and prepped and draped in the usual sterile fashion. A preoperative timeout was called.   A 21 French 30 cystoscope was inserted into the patient's bladder per urethra atraumatically. The right ureteral stent was then grasped flexible graspers and brought to level the urethral meatus. A sensor wire was exchanged through the stent up to the level of the renal pelvis under fluoroscopy. With the aid of a dual lumen catheter a second sensor wire was then placed the level of renal pelvis under fluoroscopy. A ureteral access sheath was then placed over the sensor wires to the level of the proximal right ureter under fluoroscopy. Pan nephroscopy revealed 1 stone in the mid right collecting system. The stone was broken in multiple fragments with laser lithotripsy. He was then removed with a stone basket in its entirety. Pan nephroscopy and right retrograde pyelogram revealed no further stone burden. The ureteral access sheath was then removed under direct localization showing minimal trauma. Over the remaining sensor wire with the aid of the cystoscope a 6 French by 26 cm double-J ureteral stent was then placed. There was a string on this stent. Frozen patient's renal pelvis on fluoroscopy and the urinary bladder direct visualization. The patient's bladder was drained and the cystoscope was removed. Repeat fluoroscopy showed the stent to be in the correct positions in both the bladder and the right renal  pelvis. The string was attached the patient's life. The patient will anesthesia and transferred in stable condition to the postanesthesia care  unit.  Plan: The patient will remove her stent by point in the string in 3 days. She'll follow-up in one month with a renal ultrasound prior throughout iatrogenic hydronephrosis. She continues to have urinary tract infections after having her stone burden cleared, she may benefit for vaginal estrogen cream as she was noted to have vaginal atrophy today. She does also have a bladder diverticulum though this is less likely source of her recurrent UTIs as it is not that large.  Baruch Gouty, M.D.

## 2016-03-14 NOTE — H&P (View-Only) (Signed)
Linda Gentry [birth year] [id number]  Referring provider: Madelyn Brunner, MD [facility] [place], [place] [address]      Chief Complaint  Patient presents with  . Nephrolithiasis    Ctscan results    HPI: The patient is a 70 year old female presents today for evaluation of left-sided nephrolithiasis. She is also being currently treated for Klebsiella pneumoniae urinary tract infection. On a recent renal ultrasound showed bilateral nephrolithiasis with a 5 mm stone in the right kidney and multiple stones with the largest 11 mm in the left. She also has suspected left UVJ stone 8 mm in size.  She has no personal history of nephrolithiasis before this. She is currently asymptomatic. She is on Cipro for Klebsiella pneumonia UTI. This is the second of saline UTIs she's had since June 2017.  CT scan shows bilateral nephrolithiasis. Stones on the left are punctate. She was a 6 mm stone on the right. She also has a small bladder diverticulum.  PMH:     Past Medical History:  Diagnosis Date  . Fibrocystic breast disease   . Hyperlipidemia   . Kidney stone   . Osteoporosis     Surgical History:      Past Surgical History:  Procedure Laterality Date  . BREAST BIOPSY Left 1965  . COLONOSCOPY WITH PROPOFOL N/A 11/01/2015   Procedure: COLONOSCOPY WITH PROPOFOL;  Surgeon: Lollie Sails, MD;  Location: [facility] ENDOSCOPY;  Service: Endoscopy;  Laterality: N/A;    Home Medications:        Medication List           Accurate as of 01/05/16 10:20 AM. Always use your most recent med list.           acetaminophen 500 MG tablet Commonly known as:  TYLENOL Take 1 tablet (500 mg total) by mouth every 6 (six) hours as needed.  CALCIUM 500 PO Take 1,000 mg by mouth daily.  multivitamin capsule Take 1 capsule by mouth daily.  pravastatin 20 MG tablet Commonly known as:  PRAVACHOL Take 20 mg by mouth daily.       Allergies: No Known Allergies  Family History:      Family History  Problem Relation Age of Onset  . Breast cancer Neg Hx   . Kidney cancer Neg Hx   . Kidney disease Neg Hx   . Prostate cancer Neg Hx     Social History:  reports that she has never smoked. She has never used smokeless tobacco. She reports that she does not drink alcohol or use drugs.  ROS: UROLOGY Frequent Urination?: No Hard to postpone urination?: No Burning/pain with urination?: No Get up at night to urinate?: No Leakage of urine?: No Urine stream starts and stops?: No Trouble starting stream?: No Do you have to strain to urinate?: No Blood in urine?: No Urinary tract infection?: No Sexually transmitted disease?: No Injury to kidneys or bladder?: No Painful intercourse?: No Weak stream?: No Currently pregnant?: No Vaginal bleeding?: No Last menstrual period?: n  Gastrointestinal Nausea?: No Vomiting?: No Indigestion/heartburn?: No Diarrhea?: No Constipation?: No  Constitutional Fever: No Night sweats?: No Weight loss?: No Fatigue?: No  Skin Skin rash/lesions?: No Itching?: No  Eyes Blurred vision?: No Double vision?: No  Ears/Nose/Throat Sore throat?: No Sinus problems?: No  Hematologic/Lymphatic Swollen glands?: No Easy bruising?: No  Cardiovascular Leg swelling?: No Chest pain?: No  Respiratory Cough?: No Shortness of breath?: No  Endocrine Excessive thirst?: No  Musculoskeletal  Back pain?: No Joint pain?: No  Neurological Headaches?: No Dizziness?: No  Psychologic Depression?: No Anxiety?: No  Physical Exam: BP (!) 146/77   Pulse 70   Ht 5\' 1"  (1.549 m)   Wt 119 lb (54 kg)   BMI 22.48 kg/m   Constitutional:  Alert and oriented, No acute distress. HEENT: [place] AT, moist mucus membranes.  Trachea midline, no masses. Cardiovascular: No clubbing, cyanosis, or edema. RRR Respiratory: Normal respiratory effort, no increased work  of breathing. clear GI: Abdomen is soft, nontender, nondistended, no abdominal masses GU: No CVA tenderness.  Skin: No rashes, bruises or suspicious lesions. Lymph: No cervical or inguinal adenopathy. Neurologic: Grossly intact, no focal deficits, moving all 4 extremities. Psychiatric: Normal mood and affect.  Laboratory Data: RecentLabs  No results found for: WBC, HGB, HCT, MCV, PLT    RecentLabs  No results found for: CREATININE    RecentLabs  No results found for: PSA    RecentLabs  No results found for: TESTOSTERONE    RecentLabs  No results found for: HGBA1C    Urinalysis Labs(Brief)          Component Value Date/Time   COLORURINE YELLOW 11/05/2015 1023   APPEARANCEUR HAZY (A) 11/05/2015 1023   APPEARANCEUR HAZY 02/26/2012 1039   LABSPEC 1.010 11/05/2015 1023   LABSPEC 1.005 02/26/2012 1039   PHURINE 7.0 11/05/2015 1023   GLUCOSEU NEGATIVE 11/05/2015 1023   GLUCOSEU NEGATIVE 02/26/2012 1039   HGBUR 2+ (A) 11/05/2015 1023   BILIRUBINUR NEGATIVE 11/05/2015 [place] 02/26/2012 1039   KETONESUR NEGATIVE 11/05/2015 1023   PROTEINUR NEGATIVE 11/05/2015 1023   NITRITE POSITIVE (A) 11/05/2015 1023   LEUKOCYTESUR 2+ (A) 11/05/2015 1023   LEUKOCYTESUR 2+ 02/26/2012 1039      Pertinent Imaging: CLINICAL DATA: History of kidney stones, some low back pain EXAM: CT ABDOMEN AND PELVIS WITHOUT CONTRAST TECHNIQUE: Multidetector CT imaging of the abdomen and pelvis was performed following the standard protocol without IV contrast. COMPARISON: KUB of 11/05/2015 and ultrasound of 11/17/2015 FINDINGS: The lung bases are clear. The heart is mildly enlarged. The liver is unremarkable in the unenhanced state. No calcified gallstones are seen within the contracted gallbladder. The pancreas is normal in size on this unenhanced study and no ductal dilatation is seen. The adrenal glands and spleen are unremarkable. The  stomach is not well distended. There are bilateral renal calculi, the largest in the lower pole of the right kidney measuring 62mm in diameter. The proximal ureters are normal in caliber. No hydronephrosis is seen. The abdominal aorta is normal in caliber. No adenopathy is noted. The distal ureters are normal in caliber and no distal ureteral calculus is seen. The urinary bladder is moderately urine distended, and there does appear to be a left lateral posterior urinary bladder diverticulum present. The uterus is normal in size. No adnexal lesion is seen. Multiple rectosigmoid colon diverticula are present with a few diverticula scattered throughout the more proximal colon as well. No diverticulitis is seen. The terminal ileum and the appendix are unremarkable. The lumbar vertebrae are in normal alignment with normal intervertebral disc spaces. Degenerative change is noted involving the facet joints of L5-S1. IMPRESSION: 1. Bilateral renal calculi, the largest in the lower pole of the right kidney measuring 6 mm in diameter. No hydronephrosis. No ureteral calculi. 2. Left posterolateral urinary bladder diverticulum. 3. Multiple rectosigmoid colon diverticula.  Assessment & Plan:    1. Nephrolithiasis 2. Recurrent UTI 3. Bladder diverticulum I discussed the  patient possible etiologies of her recurrent Klebsiella UTI a be related both to her stone burden as well as her bladder diverticulum. Klebsiella is classically associated with nephrolithiasis. We discussed treatment options. She has a 6 mm stone in the right kidney. She also has some very small stones in the left lower pole. We discussed addressing her stone burden with cystoscopy and bilateral ureteroscopy with laser lithotripsy. Our main objective will be to address the stone on the right side. If this goes well, we may address the very small stones in the left side but the these are not as likely be causing her recurrent UTIs due  to the size. She understands the risk, benefits, indications of this procedure. She understands the risks include bleeding, infection, iatrogenic injury, need for repeat surgery, and placement of ureteral stents. All questions were answered she is agreeable proceeding.  We also discussed her bladder diverticulum with possible source for her recurrent UTI. I feel this is less likely source in comparison to her stones due to its small size. She continues to have recurrent UTIs after she has rendered stone free, this may be worth addressing at that time.    Nickie Retort, MD  [facility] [address], [place], [place] [address] [phone]

## 2016-03-14 NOTE — Transfer of Care (Signed)
Immediate Anesthesia Transfer of Care Note  Patient: Linda Gentry  Procedure(s) Performed: Procedure(s): URETEROSCOPY WITH HOLMIUM LASER LITHOTRIPSY (Right) CYSTOSCOPY WITH STENT REPLACEMENT (Right)  Patient Location: PACU  Anesthesia Type:General  Level of Consciousness: awake, alert  and oriented  Airway & Oxygen Therapy: Patient Spontanous Breathing and Patient connected to nasal cannula oxygen  Post-op Assessment: Report given to RN and Post -op Vital signs reviewed and stable  Post vital signs: Reviewed and stable  Last Vitals:  Vitals:   03/14/16 1226 03/14/16 1433  BP: (!) 164/82 (!) 166/87  Pulse: 80 86  Resp: 16 19  Temp: 36.4 C 36.9 C    Last Pain:  Vitals:   03/14/16 1226  TempSrc: Oral         Complications: No apparent anesthesia complications

## 2016-03-14 NOTE — Anesthesia Preprocedure Evaluation (Signed)
Anesthesia Evaluation  Patient identified by MRN, date of birth, ID band Patient awake    Reviewed: Allergy & Precautions, NPO status , Patient's Chart, lab work & pertinent test results  Airway Mallampati: II       Dental  (+) Teeth Intact   Pulmonary neg pulmonary ROS,    breath sounds clear to auscultation       Cardiovascular Exercise Tolerance: Good negative cardio ROS   Rhythm:Regular Rate:Normal     Neuro/Psych negative psych ROS   GI/Hepatic negative GI ROS, Neg liver ROS,   Endo/Other  negative endocrine ROS  Renal/GU negative Renal ROS     Musculoskeletal   Abdominal Normal abdominal exam  (+)   Peds negative pediatric ROS (+)  Hematology negative hematology ROS (+)   Anesthesia Other Findings   Reproductive/Obstetrics                             Anesthesia Physical Anesthesia Plan  ASA: II  Anesthesia Plan: General   Post-op Pain Management:    Induction: Intravenous  Airway Management Planned: LMA and Oral ETT  Additional Equipment:   Intra-op Plan:   Post-operative Plan: Extubation in OR  Informed Consent: I have reviewed the patients History and Physical, chart, labs and discussed the procedure including the risks, benefits and alternatives for the proposed anesthesia with the patient or authorized representative who has indicated his/her understanding and acceptance.     Plan Discussed with: CRNA  Anesthesia Plan Comments:         Anesthesia Quick Evaluation

## 2016-03-14 NOTE — Telephone Encounter (Signed)
-----   Message from Nickie Retort, MD sent at 03/14/2016  2:28 PM EDT ----- Patient needs to see me in one month with renal ultrasound prior.

## 2016-03-14 NOTE — Interval H&P Note (Signed)
History and Physical Interval Note:  03/14/2016 12:43 PM  Neomia Dear  has presented today for surgery, with the diagnosis of RECURRENT UTI,NEPHROLITHIASIS  The various methods of treatment have been discussed with the patient and family. After consideration of risks, benefits and other options for treatment, the patient has consented to  Procedure(s): URETEROSCOPY WITH HOLMIUM LASER LITHOTRIPSY (Bilateral) CYSTOSCOPY WITH STENT REPLACEMENT (Right) CYSTOSCOPY WITH STENT PLACEMENT (Left) as a surgical intervention .  The patient's history has been reviewed, patient examined, no change in status, stable for surgery.  I have reviewed the patient's chart and labs.  Questions were answered to the patient's satisfaction.    Patient presents after right ureteral stent placed at last visit due to cloudy debris that was concerning for infection. Her urine cultures are now clear. She presents today for right ureteroscopy with possible left ureteroscopy.  Lungs clear RRR  Nickie Retort

## 2016-03-15 ENCOUNTER — Other Ambulatory Visit: Payer: Self-pay | Admitting: Urology

## 2016-03-15 DIAGNOSIS — N200 Calculus of kidney: Secondary | ICD-10-CM

## 2016-03-15 NOTE — Telephone Encounter (Signed)
Order in.

## 2016-03-15 NOTE — Anesthesia Postprocedure Evaluation (Signed)
Anesthesia Post Note  Patient: Linda Gentry  Procedure(s) Performed: Procedure(s) (LRB): URETEROSCOPY WITH HOLMIUM LASER LITHOTRIPSY (Right) CYSTOSCOPY WITH STENT REPLACEMENT (Right)  Patient location during evaluation: PACU Anesthesia Type: General Level of consciousness: awake Pain management: pain level controlled Vital Signs Assessment: post-procedure vital signs reviewed and stable Respiratory status: spontaneous breathing Cardiovascular status: stable Anesthetic complications: no    Last Vitals:  Vitals:   03/14/16 1514 03/14/16 1536  BP: (!) 177/75 (!) 160/71  Pulse: 77 72  Resp: 16 16  Temp: 36.7 C     Last Pain:  Vitals:   03/14/16 1536  TempSrc: Tympanic                 VAN STAVEREN,Leslieanne Cobarrubias

## 2016-03-15 NOTE — Telephone Encounter (Signed)
The appt has been made but there is not an order for the RUS, this needs to be ordered.  Sharyn Lull

## 2016-03-16 ENCOUNTER — Encounter: Payer: Self-pay | Admitting: Urology

## 2016-03-23 ENCOUNTER — Other Ambulatory Visit: Payer: PPO

## 2016-03-26 LAB — STONE ANALYSIS
CA OXALATE, MONOHYDR.: 40 %
CA PHOS CRY STONE QL IR: 30 %
Ca Oxalate,Dihydrate: 30 %
STONE WEIGHT KSTONE: 41 mg

## 2016-04-17 ENCOUNTER — Ambulatory Visit
Admission: RE | Admit: 2016-04-17 | Discharge: 2016-04-17 | Disposition: A | Discharge status: Home / Self Care | Payer: PPO | Source: Ambulatory Visit | Attending: Urology | Admitting: Urology

## 2016-04-17 DIAGNOSIS — Z87442 Personal history of urinary calculi: Secondary | ICD-10-CM | POA: Diagnosis not present

## 2016-04-17 DIAGNOSIS — N200 Calculus of kidney: Secondary | ICD-10-CM | POA: Diagnosis not present

## 2016-04-19 ENCOUNTER — Ambulatory Visit (INDEPENDENT_AMBULATORY_CARE_PROVIDER_SITE_OTHER): Payer: PPO | Admitting: Urology

## 2016-04-19 VITALS — BP 126/81 | HR 72 | Ht 61.0 in | Wt 122.6 lb

## 2016-04-19 DIAGNOSIS — N200 Calculus of kidney: Secondary | ICD-10-CM

## 2016-04-19 DIAGNOSIS — N390 Urinary tract infection, site not specified: Secondary | ICD-10-CM

## 2016-04-19 DIAGNOSIS — N952 Postmenopausal atrophic vaginitis: Secondary | ICD-10-CM

## 2016-04-19 NOTE — Progress Notes (Signed)
04/19/2016 9:36 AM   Linda Gentry 05-08-1946 [id number]  Referring provider: Madelyn Brunner, MD [facility] [place], [place] [address]  Chief Complaint  Patient presents with  . Follow-up    diagnostic study, [place]     HPI: The patient is a 70 year old female with a past metal history of nephrolithiasis, recurrent UTI, bladder diverticulum, and vaginal atrophy who presents today after undergoing a right ureteroscopy for removal of the 6 mm right nonobstructing midpole stone. She removed her stent on her own about 1 month ago. Follow-up ultrasound shows no stones or hydronephrosis.  The plan is been this time that she is stone free to monitor for her recurrence of urinary tract infections (Klebsiella) and if she continues to have these infections to start her on vaginal estrogen cream.  Her stone was 30% calcium oxalate dihydrate, 40% calcium oxalate monohydrate, and 30% calcium phosphate.  She has had no urinary tract infections since her surgery. She has done well since surgery.  PMH: Past Medical History:  Diagnosis Date  . Fibrocystic breast disease   . Hyperlipidemia   . Kidney stone   . Osteoporosis     Surgical History: Past Surgical History:  Procedure Laterality Date  . BREAST BIOPSY Left 1965  . COLONOSCOPY WITH PROPOFOL N/A 11/01/2015   Procedure: COLONOSCOPY WITH PROPOFOL;  Surgeon: Lollie Sails, MD;  Location[facility] ENDOSCOPY;  Service: Endoscopy;  Laterality: N/A;  . CYSTOSCOPY W/ URETERAL STENT PLACEMENT Right 02/15/2016   Procedure: CYSTOSCOPY WITH RETROGRADE PYELOGRAM/URETERAL STENT PLACEMENT;  Surgeon: Nickie Retort, MD;  Location: [facility] ORS;  Service: Urology;  Laterality: Right;  . CYSTOSCOPY W/ URETERAL STENT PLACEMENT Right 03/14/2016   Procedure: CYSTOSCOPY WITH STENT REPLACEMENT;  Surgeon: Nickie Retort, MD;  Location: [facility] ORS;  Service: Urology;  Laterality: Right;  . URETEROSCOPY WITH HOLMIUM LASER  LITHOTRIPSY Right 03/14/2016   Procedure: URETEROSCOPY WITH HOLMIUM LASER LITHOTRIPSY;  Surgeon: Nickie Retort, MD;  Location: [facility] ORS;  Service: Urology;  Laterality: Right;    Home Medications:    Medication List       Accurate as of 04/19/16  9:36 AM. Always use your most recent med list.          acetaminophen 500 MG tablet Commonly known as:  TYLENOL Take 1 tablet (500 mg total) by mouth every 6 (six) hours as needed.   CALCIUM 500 PO Take 1 tablet by mouth 2 (two) times daily.   cephALEXin 500 MG capsule Commonly known as:  KEFLEX Take 1 capsule (500 mg total) by mouth 3 (three) times daily.   HYDROcodone-acetaminophen 5-325 MG tablet Commonly known as:  NORCO Take 1 tablet by mouth every 4 (four) hours as needed for moderate pain.   multivitamin capsule Take 1 capsule by mouth daily.   pravastatin 20 MG tablet Commonly known as:  PRAVACHOL Take 20 mg by mouth at bedtime.   timolol 0.5 % ophthalmic solution Commonly known as:  TIMOPTIC Place 1 drop into both eyes daily.       Allergies: No Known Allergies  Family History: Family History  Problem Relation Age of Onset  . Breast cancer Neg Hx   . Kidney cancer Neg Hx   . Kidney disease Neg Hx   . Prostate cancer Neg Hx     Social History:  reports that she has never smoked. She has never used smokeless tobacco. She reports that she does not drink alcohol or use drugs.  ROS: UROLOGY Frequent Urination?: No Hard to postpone urination?: No Burning/pain with urination?: No Get up at night to urinate?: No Leakage of urine?: No Urine stream starts and stops?: No Trouble starting stream?: No Do you have to strain to urinate?: No Blood in urine?: No Urinary tract infection?: No Sexually transmitted disease?: No Injury to kidneys or bladder?: No Painful intercourse?: No Weak stream?: No Currently pregnant?: No Vaginal bleeding?: No Last menstrual period?: n  Gastrointestinal Nausea?:  No Vomiting?: No Indigestion/heartburn?: No Diarrhea?: No Constipation?: No  Constitutional Fever: No Night sweats?: No Weight loss?: No Fatigue?: No  Skin Skin rash/lesions?: No Itching?: No  Eyes Blurred vision?: No Double vision?: No  Ears/Nose/Throat Sore throat?: No Sinus problems?: No  Hematologic/Lymphatic Swollen glands?: No Easy bruising?: No  Cardiovascular Leg swelling?: No Chest pain?: No  Respiratory Cough?: No Shortness of breath?: No  Endocrine Excessive thirst?: No  Musculoskeletal Back pain?: No Joint pain?: No  Neurological Headaches?: No Dizziness?: No  Psychologic Depression?: No Anxiety?: No  Physical Exam: BP 126/81   Pulse 72   Ht 5\' 1"  (1.549 m)   Wt 122 lb 9.6 oz (55.6 kg)   BMI 23.17 kg/m   Constitutional:  Alert and oriented, No acute distress. HEENT: [place] AT, moist mucus membranes.  Trachea midline, no masses. Cardiovascular: No clubbing, cyanosis, or edema. Respiratory: Normal respiratory effort, no increased work of breathing. GI: Abdomen is soft, nontender, nondistended, no abdominal masses GU: No CVA tenderness.  Skin: No rashes, bruises or suspicious lesions. Lymph: No cervical or inguinal adenopathy. Neurologic: Grossly intact, no focal deficits, moving all 4 extremities. Psychiatric: Normal mood and affect.  Laboratory Data: No results found for: WBC, HGB, HCT, MCV, PLT  No results found for: CREATININE  No results found for: PSA  No results found for: TESTOSTERONE  No results found for: HGBA1C  Urinalysis    Component Value Date/Time   COLORURINE YELLOW 11/05/2015 1023   APPEARANCEUR Cloudy (A) 03/05/2016 1334   LABSPEC 1.010 11/05/2015 1023   LABSPEC 1.005 02/26/2012 1039   PHURINE 7.0 11/05/2015 1023   GLUCOSEU Negative 03/05/2016 1334   GLUCOSEU NEGATIVE 02/26/2012 1039   HGBUR 2+ (A) 11/05/2015 1023   BILIRUBINUR Negative 03/05/2016 1334   BILIRUBINUR NEGATIVE 02/26/2012 1039   KETONESUR  NEGATIVE 11/05/2015 1023   PROTEINUR 2+ (A) 03/05/2016 1334   PROTEINUR NEGATIVE 11/05/2015 1023   NITRITE Negative 03/05/2016 1334   NITRITE POSITIVE (A) 11/05/2015 1023   LEUKOCYTESUR 3+ (A) 03/05/2016 1334   LEUKOCYTESUR 2+ 02/26/2012 1039    Pertinent Imaging: CLINICAL DATA:  70 year old female with nephrolithiasis status post lithotripsy on 03/14/2016. Subsequent encounter.  EXAM: RENAL / URINARY TRACT ULTRASOUND COMPLETE  COMPARISON:  Noncontrast CT Abdomen and Pelvis 12/29/2015.  FINDINGS: Right Kidney:  Length: 9.5 cm. Echogenicity within normal limits. No mass or hydronephrosis visualized.  Left Kidney:  Length: 9.8 cm. Echogenicity within normal limits. No mass or hydronephrosis visualized.  Bladder:  Appears normal for degree of bladder distention. Both ureteral jets are detected with Doppler. A minimal postvoid residual was noted (image 36).  IMPRESSION: Negative sonographic appearance of both kidneys and the urinary bladder. No sonographically evident nephrolithiasis.  Assessment & Plan:    1. Nephrolithiasis No further evidence of stone burden. Post instrumentation ultrasound normal. We discussed with her friend stones in the future. She was given the ABCs of stone formation booklet. Next  2. Recurrent UTI Hopefully this was secondary to above. If she develops further urinary tract infections, she  will contact the office. We'll consider vaginal surgery cream at that time. She will otherwise follow up as needed.   3. Vaginal atrophy As above  Return if symptoms worsen or fail to improve.  Nickie Retort, MD  [facility] [address] [place], [place] [address] [phone]

## 2016-07-03 DIAGNOSIS — B009 Herpesviral infection, unspecified: Secondary | ICD-10-CM | POA: Diagnosis not present

## 2016-07-26 DIAGNOSIS — H40013 Open angle with borderline findings, low risk, bilateral: Secondary | ICD-10-CM | POA: Diagnosis not present

## 2016-07-26 DIAGNOSIS — H2513 Age-related nuclear cataract, bilateral: Secondary | ICD-10-CM | POA: Diagnosis not present

## 2016-07-26 DIAGNOSIS — H401130 Primary open-angle glaucoma, bilateral, stage unspecified: Secondary | ICD-10-CM | POA: Diagnosis not present

## 2016-08-27 DIAGNOSIS — E7800 Pure hypercholesterolemia, unspecified: Secondary | ICD-10-CM | POA: Diagnosis not present

## 2016-08-27 DIAGNOSIS — N200 Calculus of kidney: Secondary | ICD-10-CM | POA: Diagnosis not present

## 2016-08-27 DIAGNOSIS — Z0000 Encounter for general adult medical examination without abnormal findings: Secondary | ICD-10-CM | POA: Diagnosis not present

## 2016-08-27 DIAGNOSIS — Z0001 Encounter for general adult medical examination with abnormal findings: Secondary | ICD-10-CM | POA: Diagnosis not present

## 2016-08-27 DIAGNOSIS — E2839 Other primary ovarian failure: Secondary | ICD-10-CM | POA: Diagnosis not present

## 2016-08-27 DIAGNOSIS — Z1231 Encounter for screening mammogram for malignant neoplasm of breast: Secondary | ICD-10-CM | POA: Diagnosis not present

## 2016-08-29 ENCOUNTER — Other Ambulatory Visit: Payer: Self-pay | Admitting: Internal Medicine

## 2016-08-29 DIAGNOSIS — Z1231 Encounter for screening mammogram for malignant neoplasm of breast: Secondary | ICD-10-CM

## 2016-09-06 DIAGNOSIS — D649 Anemia, unspecified: Secondary | ICD-10-CM | POA: Diagnosis not present

## 2016-09-17 ENCOUNTER — Ambulatory Visit
Admission: RE | Admit: 2016-09-17 | Discharge: 2016-09-17 | Disposition: A | Discharge status: Home / Self Care | Payer: PPO | Source: Ambulatory Visit | Attending: Internal Medicine | Admitting: Internal Medicine

## 2016-09-17 DIAGNOSIS — Z1231 Encounter for screening mammogram for malignant neoplasm of breast: Secondary | ICD-10-CM | POA: Diagnosis not present

## 2016-09-18 DIAGNOSIS — M810 Age-related osteoporosis without current pathological fracture: Secondary | ICD-10-CM | POA: Diagnosis not present

## 2017-01-24 DIAGNOSIS — H40013 Open angle with borderline findings, low risk, bilateral: Secondary | ICD-10-CM | POA: Diagnosis not present

## 2017-01-24 DIAGNOSIS — H2513 Age-related nuclear cataract, bilateral: Secondary | ICD-10-CM | POA: Diagnosis not present

## 2017-02-01 ENCOUNTER — Ambulatory Visit
Admission: EM | Admit: 2017-02-01 | Discharge: 2017-02-01 | Disposition: A | Discharge status: Home / Self Care | Payer: PPO | Attending: Family Medicine | Admitting: Family Medicine

## 2017-02-01 DIAGNOSIS — R319 Hematuria, unspecified: Secondary | ICD-10-CM | POA: Diagnosis not present

## 2017-02-01 DIAGNOSIS — N390 Urinary tract infection, site not specified: Secondary | ICD-10-CM

## 2017-02-01 LAB — URINALYSIS, COMPLETE (UACMP) WITH MICROSCOPIC
Bilirubin Urine: NEGATIVE
GLUCOSE, UA: NEGATIVE mg/dL
KETONES UR: NEGATIVE mg/dL
Nitrite: NEGATIVE
PROTEIN: 100 mg/dL — AB
SQUAMOUS EPITHELIAL / LPF: NONE SEEN
Specific Gravity, Urine: 1.025 (ref 1.005–1.030)
pH: 7.5 (ref 5.0–8.0)

## 2017-02-01 MED ORDER — NITROFURANTOIN MONOHYD MACRO 100 MG PO CAPS: 100.0000 mg | ORAL_CAPSULE | Freq: Two times a day (BID) | ORAL | 0 refills | Status: DC

## 2017-02-01 NOTE — ED Provider Notes (Signed)
[facility]    CSN: [id number] Arrival date & time: 02/01/17  1607     History   Chief Complaint Chief Complaint  Patient presents with  . Urinary Frequency    HPI Linda Gentry is a 71 y.o. female.   The history is provided by the patient.  Urinary Frequency  This is a new problem. The current episode started more than 2 days ago. The problem occurs constantly. The problem has been gradually worsening. Pertinent negatives include no chest pain, no abdominal pain, no headaches and no shortness of breath. Associated symptoms comments: Denies fevers, chills, vomiting, flank or abdominal pain. She has tried nothing for the symptoms.    Past Medical History:  Diagnosis Date  . Fibrocystic breast disease   . Hyperlipidemia   . Kidney stone   . Osteoporosis     Patient Active Problem List   Diagnosis Date Noted  . Hyperlipidemia 08/16/2014  . 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 BREAST BIOPSY Left 1965   neg  . COLONOSCOPY WITH PROPOFOL N/A 11/01/2015   Procedure: COLONOSCOPY WITH PROPOFOL;  Surgeon: Lollie Sails, MD;  Location: [facility] ENDOSCOPY;  Service: Endoscopy;  Laterality: N/A;  . CYSTOSCOPY W/ URETERAL STENT PLACEMENT Right 02/15/2016   Procedure: CYSTOSCOPY WITH RETROGRADE PYELOGRAM/URETERAL STENT PLACEMENT;  Surgeon: Nickie Retort, MD;  Location: [facility] ORS;  Service: Urology;  Laterality: Right;  . CYSTOSCOPY W/ URETERAL STENT PLACEMENT Right 03/14/2016   Procedure: CYSTOSCOPY WITH STENT REPLACEMENT;  Surgeon: Nickie Retort, MD;  Location: [facility] ORS;  Service: Urology;  Laterality: Right;  . URETEROSCOPY WITH HOLMIUM LASER LITHOTRIPSY Right 03/14/2016   Procedure: URETEROSCOPY WITH HOLMIUM LASER LITHOTRIPSY;  Surgeon: Nickie Retort, MD;  Location: [facility] ORS;  Service: Urology;  Laterality: Right;    OB History    No data available       Home Medications    Prior to  Admission medications   Medication Sig Start Date End Date Taking? Authorizing Provider  Calcium-Magnesium-Vitamin D (CALCIUM 500 PO) Take 1 tablet by mouth 2 (two) times daily.    Yes [provider]  Multiple Vitamin (MULTIVITAMIN) capsule Take 1 capsule by mouth daily.   Yes [provider]  pravastatin (PRAVACHOL) 20 MG tablet Take 20 mg by mouth at bedtime.    Yes [provider]  timolol (TIMOPTIC) 0.5 % ophthalmic solution Place 1 drop into both eyes daily.   Yes [provider]  acetaminophen (TYLENOL) 500 MG tablet Take 1 tablet (500 mg total) by mouth every 6 (six) hours as needed. 11/05/15   Betancourt, Aura Fey, NP  cephALEXin (KEFLEX) 500 MG capsule Take 1 capsule (500 mg total) by mouth 3 (three) times daily. Patient not taking: Reported on 04/19/2016 03/14/16   Nickie Retort, MD  HYDROcodone-acetaminophen [facility]) 5-325 MG tablet Take 1 tablet by mouth every 4 (four) hours as needed for moderate pain. Patient not taking: Reported on 04/19/2016 03/14/16   Nickie Retort, MD  nitrofurantoin, macrocrystal-monohydrate, (MACROBID) 100 MG capsule Take 1 capsule (100 mg total) by mouth 2 (two) times daily. 02/01/17   Norval Gable, MD    Family History Family History  Problem Relation Age of Onset  . Breast cancer Neg Hx   . Kidney cancer Neg Hx   . Kidney disease Neg Hx   . Prostate cancer Neg Hx     Social History Social History  Substance Use Topics  .  Smoking status: Never Smoker  . Smokeless tobacco: Never Used  . Alcohol use No     Allergies   Patient has no known allergies.   Review of Systems Review of Systems  Respiratory: Negative for shortness of breath.   Cardiovascular: Negative for chest pain.  Gastrointestinal: Negative for abdominal pain.  Genitourinary: Positive for frequency.  Neurological: Negative for headaches.     Physical Exam Triage Vital Signs ED Triage Vitals  Enc Vitals Group     BP 02/01/17  1013 136/69     Pulse Rate 02/01/17 1013 86     Resp 02/01/17 1013 18     Temp 02/01/17 1013 97.8 F (36.6 C)     Temp Source 02/01/17 1013 Oral     SpO2 02/01/17 1013 100 %     Weight 02/01/17 1010 122 lb (55.3 kg)     Height 02/01/17 1010 5\' 1"  (1.549 m)     Head Circumference --      Peak Flow --      Pain Score 02/01/17 1010 0     Pain Loc --      Pain Edu? --      Excl. in [place]? --    No data found.   Updated Vital Signs BP 136/69 (BP Location: Left Arm)   Pulse 86   Temp 97.8 F (36.6 C) (Oral)   Resp 18   Ht 5\' 1"  (1.549 m)   Wt 122 lb (55.3 kg)   SpO2 100%   BMI 23.05 kg/m   Visual Acuity Right Eye Distance:   Left Eye Distance:   Bilateral Distance:    Right Eye Near:   Left Eye Near:    Bilateral Near:     Physical Exam  Constitutional: She appears well-developed and well-nourished. No distress.  Abdominal: Soft. Bowel sounds are normal. She exhibits no distension and no mass. There is no tenderness. There is no rebound and no guarding.  Skin: She is not diaphoretic.  Nursing note and vitals reviewed.    UC Treatments / Results  Labs (all labs ordered are listed, but only abnormal results are displayed) Labs Reviewed  URINALYSIS, COMPLETE (UACMP) WITH MICROSCOPIC - Abnormal; Notable for the following:       Result Value   APPearance CLOUDY (*)    Hgb urine dipstick MODERATE (*)    Protein, ur 100 (*)    Leukocytes, UA LARGE (*)    Bacteria, UA MANY (*)    All other components within normal limits  URINE CULTURE    EKG  EKG Interpretation None       Radiology No results found.  Procedures Procedures (including critical care time)  Medications Ordered in UC Medications - No data to display   Initial Impression / Assessment and Plan / UC Course  I have reviewed the triage vital signs and the nursing notes.  Pertinent labs & imaging results that were available during my care of the patient were reviewed by me and considered in my  medical decision making (see chart for details).      Final Clinical Impressions(s) / UC Diagnoses   Final diagnoses:  Urinary tract infection with hematuria, site unspecified    New Prescriptions Discharge Medication List as of 02/01/2017 10:37 AM    START taking these medications   Details  nitrofurantoin, macrocrystal-monohydrate, (MACROBID) 100 MG capsule Take 1 capsule (100 mg total) by mouth 2 (two) times daily., Starting Fri 02/01/2017, Normal  1. Lab results and diagnosis reviewed with patient 2. rx as per orders above; reviewed possible side effects, interactions, risks and benefits  3. Recommend supportive treatment with increased water intake 4. Follow-up prn if symptoms worsen or don't improve  Controlled Substance Prescriptions [place] Controlled Substance Registry consulted? Not Applicable   Norval Gable, MD 02/01/17 1040

## 2017-02-01 NOTE — ED Triage Notes (Signed)
Patient complains of urinary frequency and urgency that started one week ago, off and on.

## 2017-02-02 LAB — URINE CULTURE
CULTURE: NO GROWTH
Special Requests: NORMAL

## 2017-06-15 ENCOUNTER — Other Ambulatory Visit: Payer: Self-pay

## 2017-06-15 ENCOUNTER — Encounter: Payer: Self-pay | Admitting: Gynecology

## 2017-06-15 ENCOUNTER — Ambulatory Visit
Admission: EM | Admit: 2017-06-15 | Discharge: 2017-06-15 | Disposition: A | Discharge status: Home / Self Care | Payer: PPO | Attending: Family Medicine | Admitting: Family Medicine

## 2017-06-15 DIAGNOSIS — N3001 Acute cystitis with hematuria: Secondary | ICD-10-CM | POA: Diagnosis not present

## 2017-06-15 DIAGNOSIS — R350 Frequency of micturition: Secondary | ICD-10-CM

## 2017-06-15 DIAGNOSIS — R300 Dysuria: Secondary | ICD-10-CM | POA: Diagnosis not present

## 2017-06-15 LAB — URINALYSIS, COMPLETE (UACMP) WITH MICROSCOPIC
BILIRUBIN URINE: NEGATIVE
Glucose, UA: NEGATIVE mg/dL
Ketones, ur: NEGATIVE mg/dL
Nitrite: NEGATIVE
Protein, ur: NEGATIVE mg/dL
SPECIFIC GRAVITY, URINE: 1.015 (ref 1.005–1.030)
Squamous Epithelial / LPF: NONE SEEN
pH: 7.5 (ref 5.0–8.0)

## 2017-06-15 MED ORDER — NITROFURANTOIN MONOHYD MACRO 100 MG PO CAPS: 100.0000 mg | ORAL_CAPSULE | Freq: Two times a day (BID) | ORAL | 0 refills | Status: DC

## 2017-06-15 NOTE — ED Triage Notes (Signed)
Patient c/o frequency urination x 5 days.

## 2017-06-15 NOTE — Discharge Instructions (Signed)
Please take antibiotics as prescribed.  Make sure you are drinking lots of fluids.  Return to the clinic for any increasing pain, back pain, fevers, nausea, vomiting or any worsening symptoms or changes in health

## 2017-06-15 NOTE — ED Provider Notes (Signed)
[facility]    CSN: [id number] Arrival date & time: 06/15/17  0935     History   Chief Complaint Chief Complaint  Patient presents with  . Urinary Tract Infection    HPI Linda Gentry is a 72 y.o. female.  Presents to the urgent care facility for evaluation of UTI.  Patient states since Monday she had increase in urinary frequency with slight discomfort with urination.  No abdominal pain, back pain, nausea, vomiting, diarrhea, fevers.  She has not take any medications for her discomfort.  She feels as if she has a UTI, last UTI was back in August 2018 responded well to [employer].  HPI  Past Medical History:  Diagnosis Date  . Fibrocystic breast disease   . Hyperlipidemia   . Kidney stone   . Osteoporosis     Patient Active Problem List   Diagnosis Date Noted  . Hyperlipidemia 08/16/2014  . 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 BREAST BIOPSY Left 1965   neg  . COLONOSCOPY WITH PROPOFOL N/A 11/01/2015   Procedure: COLONOSCOPY WITH PROPOFOL;  Surgeon: Lollie Sails, MD;  Location: [facility] ENDOSCOPY;  Service: Endoscopy;  Laterality: N/A;  . CYSTOSCOPY W/ URETERAL STENT PLACEMENT Right 02/15/2016   Procedure: CYSTOSCOPY WITH RETROGRADE PYELOGRAM/URETERAL STENT PLACEMENT;  Surgeon: Nickie Retort, MD;  Location: [facility] ORS;  Service: Urology;  Laterality: Right;  . CYSTOSCOPY W/ URETERAL STENT PLACEMENT Right 03/14/2016   Procedure: CYSTOSCOPY WITH STENT REPLACEMENT;  Surgeon: Nickie Retort, MD;  Location: [facility] ORS;  Service: Urology;  Laterality: Right;  . URETEROSCOPY WITH HOLMIUM LASER LITHOTRIPSY Right 03/14/2016   Procedure: URETEROSCOPY WITH HOLMIUM LASER LITHOTRIPSY;  Surgeon: Nickie Retort, MD;  Location: [facility] ORS;  Service: Urology;  Laterality: Right;    OB History    No data available       Home Medications    Prior to Admission medications   Medication Sig Start Date  End Date Taking? Authorizing Provider  acetaminophen (TYLENOL) 500 MG tablet Take 1 tablet (500 mg total) by mouth every 6 (six) hours as needed. 11/05/15  Yes Betancourt, Aura Fey, NP  Calcium-Magnesium-Vitamin D (CALCIUM 500 PO) Take 1 tablet by mouth 2 (two) times daily.    Yes [provider]  Multiple Vitamin (MULTIVITAMIN) capsule Take 1 capsule by mouth daily.   Yes [provider]  pravastatin (PRAVACHOL) 20 MG tablet Take 20 mg by mouth at bedtime.    Yes [provider]  timolol (TIMOPTIC) 0.5 % ophthalmic solution Place 1 drop into both eyes daily.   Yes [provider]  cephALEXin (KEFLEX) 500 MG capsule Take 1 capsule (500 mg total) by mouth 3 (three) times daily. Patient not taking: Reported on 04/19/2016 03/14/16   Nickie Retort, MD  HYDROcodone-acetaminophen [facility]) 5-325 MG tablet Take 1 tablet by mouth every 4 (four) hours as needed for moderate pain. Patient not taking: Reported on 04/19/2016 03/14/16   Nickie Retort, MD  nitrofurantoin, macrocrystal-monohydrate, (MACROBID) 100 MG capsule Take 1 capsule (100 mg total) by mouth 2 (two) times daily. 06/15/17   Duanne Guess, PA-C    Family History Family History  Problem Relation Age of Onset  . Breast cancer Neg Hx   . Kidney cancer Neg Hx   . Kidney disease Neg Hx   . Prostate cancer Neg Hx     Social History Social History   Tobacco Use  . Smoking status:  Never Smoker  . Smokeless tobacco: Never Used  Substance Use Topics  . Alcohol use: No  . Drug use: No     Allergies   Patient has no known allergies.   Review of Systems Review of Systems  Constitutional: Negative for fever.  Respiratory: Negative for shortness of breath.   Cardiovascular: Negative for chest pain.  Gastrointestinal: Negative for abdominal pain, nausea and vomiting.  Genitourinary: Positive for dysuria and frequency. Negative for difficulty urinating and urgency.  Musculoskeletal: Negative  for back pain and myalgias.  Skin: Negative for rash.  Neurological: Negative for dizziness and headaches.     Physical Exam Triage Vital Signs ED Triage Vitals  Enc Vitals Group     BP 06/15/17 1004 (!) 145/70     Pulse Rate 06/15/17 1004 63     Resp 06/15/17 1004 16     Temp 06/15/17 1004 98 F (36.7 C)     Temp Source 06/15/17 1004 Oral     SpO2 06/15/17 1004 98 %     Weight 06/15/17 1001 120 lb (54.4 kg)     Height 06/15/17 1001 5\' 1"  (1.549 m)     Head Circumference --      Peak Flow --      Pain Score 06/15/17 1002 0     Pain Loc --      Pain Edu? --      Excl. in [place]? --    No data found.  Updated Vital Signs BP (!) 145/70 (BP Location: Left Arm)   Pulse 63   Temp 98 F (36.7 C) (Oral)   Resp 16   Ht 5\' 1"  (1.549 m)   Wt 120 lb (54.4 kg)   SpO2 98%   BMI 22.67 kg/m   Visual Acuity Right Eye Distance:   Left Eye Distance:   Bilateral Distance:    Right Eye Near:   Left Eye Near:    Bilateral Near:     Physical Exam  Constitutional: She is oriented to person, place, and time. She appears well-developed and well-nourished.  HENT:  Head: Normocephalic and atraumatic.  Eyes: Conjunctivae are normal.  Neck: Normal range of motion.  Cardiovascular: Normal rate.  Pulmonary/Chest: Effort normal. No respiratory distress.  Abdominal: Soft. She exhibits no distension. There is no tenderness. There is no guarding.  No flank pain  Musculoskeletal: Normal range of motion.  Neurological: She is alert and oriented to person, place, and time.  Skin: Skin is warm. No rash noted.  Psychiatric: She has a normal mood and affect. Her behavior is normal. Thought content normal.     UC Treatments / Results  Labs (all labs ordered are listed, but only abnormal results are displayed) Labs Reviewed  URINALYSIS, COMPLETE (UACMP) WITH MICROSCOPIC - Abnormal; Notable for the following components:      Result Value   Hgb urine dipstick TRACE (*)    Leukocytes, UA LARGE  (*)    Bacteria, UA RARE (*)    All other components within normal limits    EKG  EKG Interpretation None       Radiology No results found.  Procedures Procedures (including critical care time)  Medications Ordered in UC Medications - No data to display   Initial Impression / Assessment and Plan / UC Course  I have reviewed the triage vital signs and the nursing notes.  Pertinent labs & imaging results that were available during my care of the patient were reviewed by me and considered in  my medical decision making (see chart for details).     72 year old female with urinary tract infection.  She is treated with Macrobid.  She will increase fluids.  She is educated on signs and symptoms to return to the clinic for.  Final Clinical Impressions(s) / UC Diagnoses   Final diagnoses:  Acute cystitis with hematuria    ED Discharge Orders        Ordered    nitrofurantoin, macrocrystal-monohydrate, (MACROBID) 100 MG capsule  2 times daily     06/15/17 1042        Duanne Guess, [place] 06/15/17 1047

## 2017-07-25 DIAGNOSIS — H401131 Primary open-angle glaucoma, bilateral, mild stage: Secondary | ICD-10-CM | POA: Diagnosis not present

## 2017-07-25 DIAGNOSIS — H2513 Age-related nuclear cataract, bilateral: Secondary | ICD-10-CM | POA: Diagnosis not present

## 2017-08-21 DIAGNOSIS — E7800 Pure hypercholesterolemia, unspecified: Secondary | ICD-10-CM | POA: Diagnosis not present

## 2017-08-21 DIAGNOSIS — Z5181 Encounter for therapeutic drug level monitoring: Secondary | ICD-10-CM | POA: Diagnosis not present

## 2017-08-29 DIAGNOSIS — Z1231 Encounter for screening mammogram for malignant neoplasm of breast: Secondary | ICD-10-CM | POA: Diagnosis not present

## 2017-08-29 DIAGNOSIS — M810 Age-related osteoporosis without current pathological fracture: Secondary | ICD-10-CM | POA: Diagnosis not present

## 2017-08-29 DIAGNOSIS — Z0000 Encounter for general adult medical examination without abnormal findings: Secondary | ICD-10-CM | POA: Diagnosis not present

## 2017-08-29 DIAGNOSIS — E7800 Pure hypercholesterolemia, unspecified: Secondary | ICD-10-CM | POA: Diagnosis not present

## 2017-09-06 ENCOUNTER — Other Ambulatory Visit: Payer: Self-pay | Admitting: Internal Medicine

## 2017-09-06 DIAGNOSIS — Z1231 Encounter for screening mammogram for malignant neoplasm of breast: Secondary | ICD-10-CM

## 2017-09-19 ENCOUNTER — Ambulatory Visit
Admission: RE | Admit: 2017-09-19 | Discharge: 2017-09-19 | Disposition: A | Discharge status: Home / Self Care | Payer: PPO | Source: Ambulatory Visit | Attending: Internal Medicine | Admitting: Internal Medicine

## 2017-09-19 DIAGNOSIS — Z1231 Encounter for screening mammogram for malignant neoplasm of breast: Secondary | ICD-10-CM | POA: Diagnosis not present

## 2017-10-06 IMAGING — MG MM DIGITAL SCREENING BILAT W/ CAD
4 series · 4 of 4 positions shown · non-contrast
Comparison: Previous exam(s).

CLINICAL DATA: Screening.

EXAM:
DIGITAL SCREENING BILATERAL MAMMOGRAM WITH CAD

[R MLO]
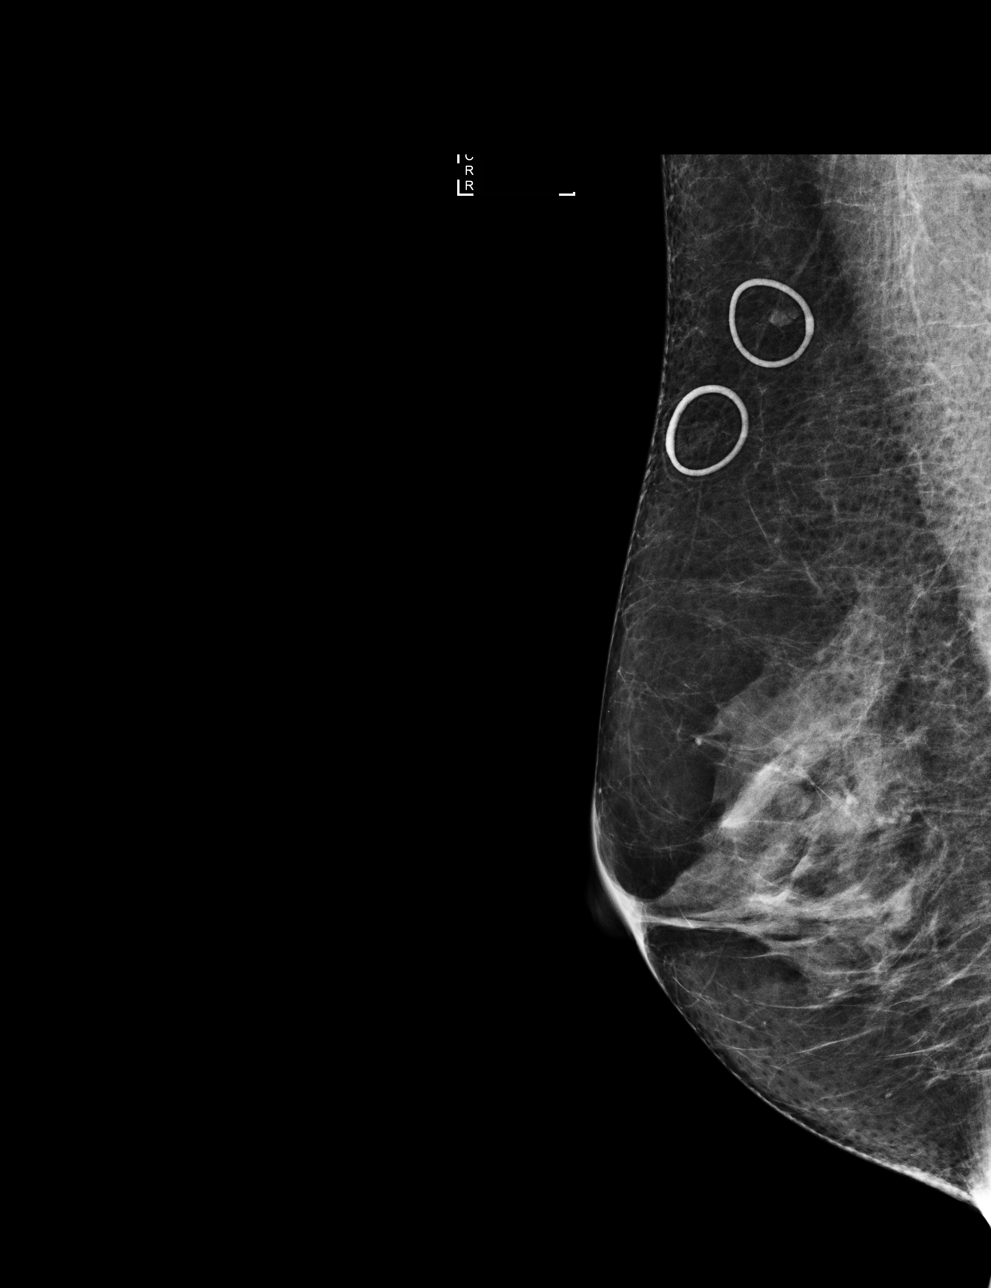

[L MLO]
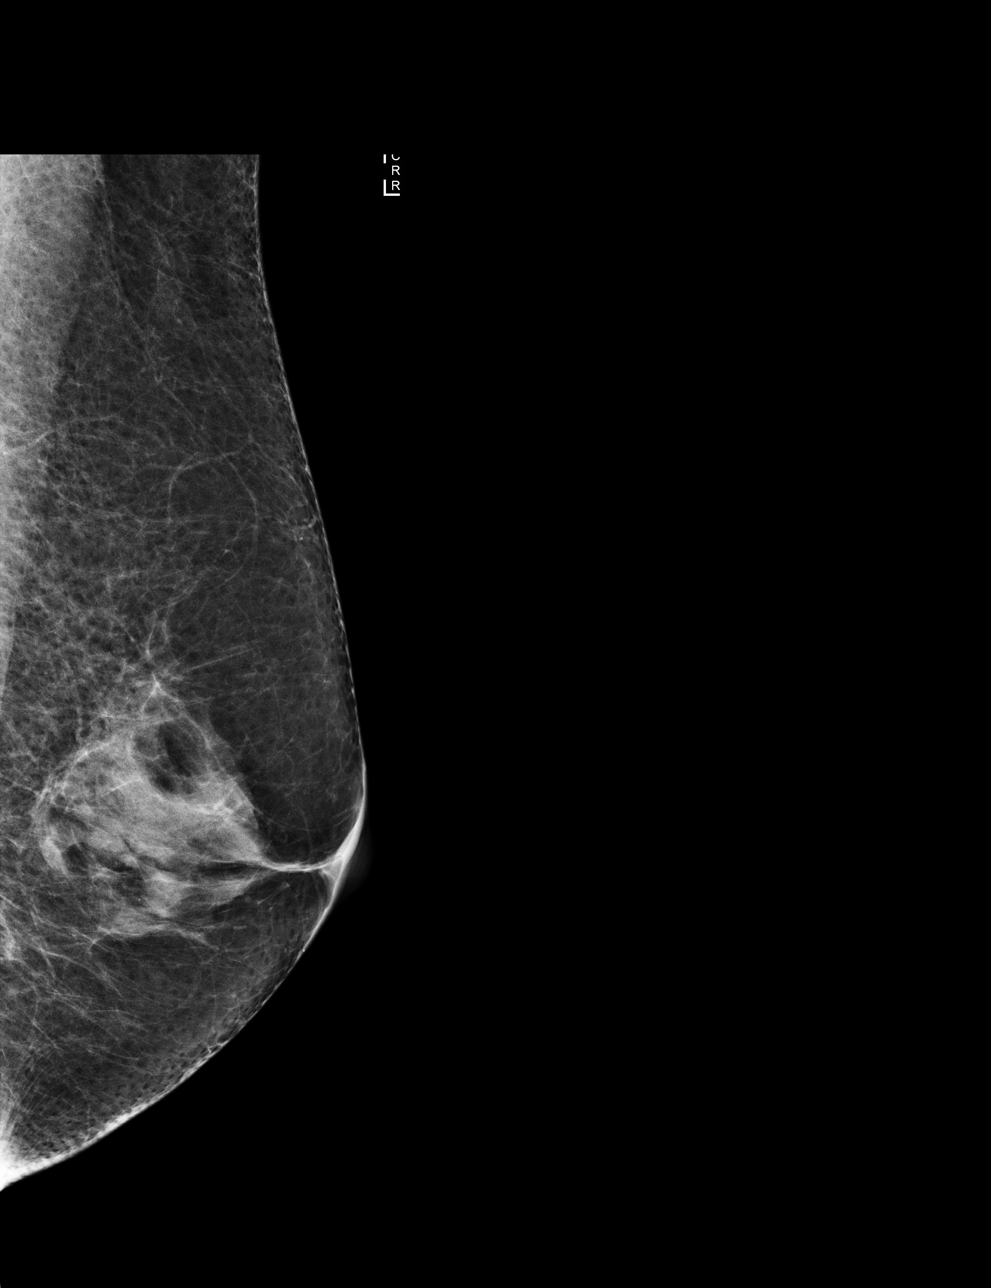

[R CC]
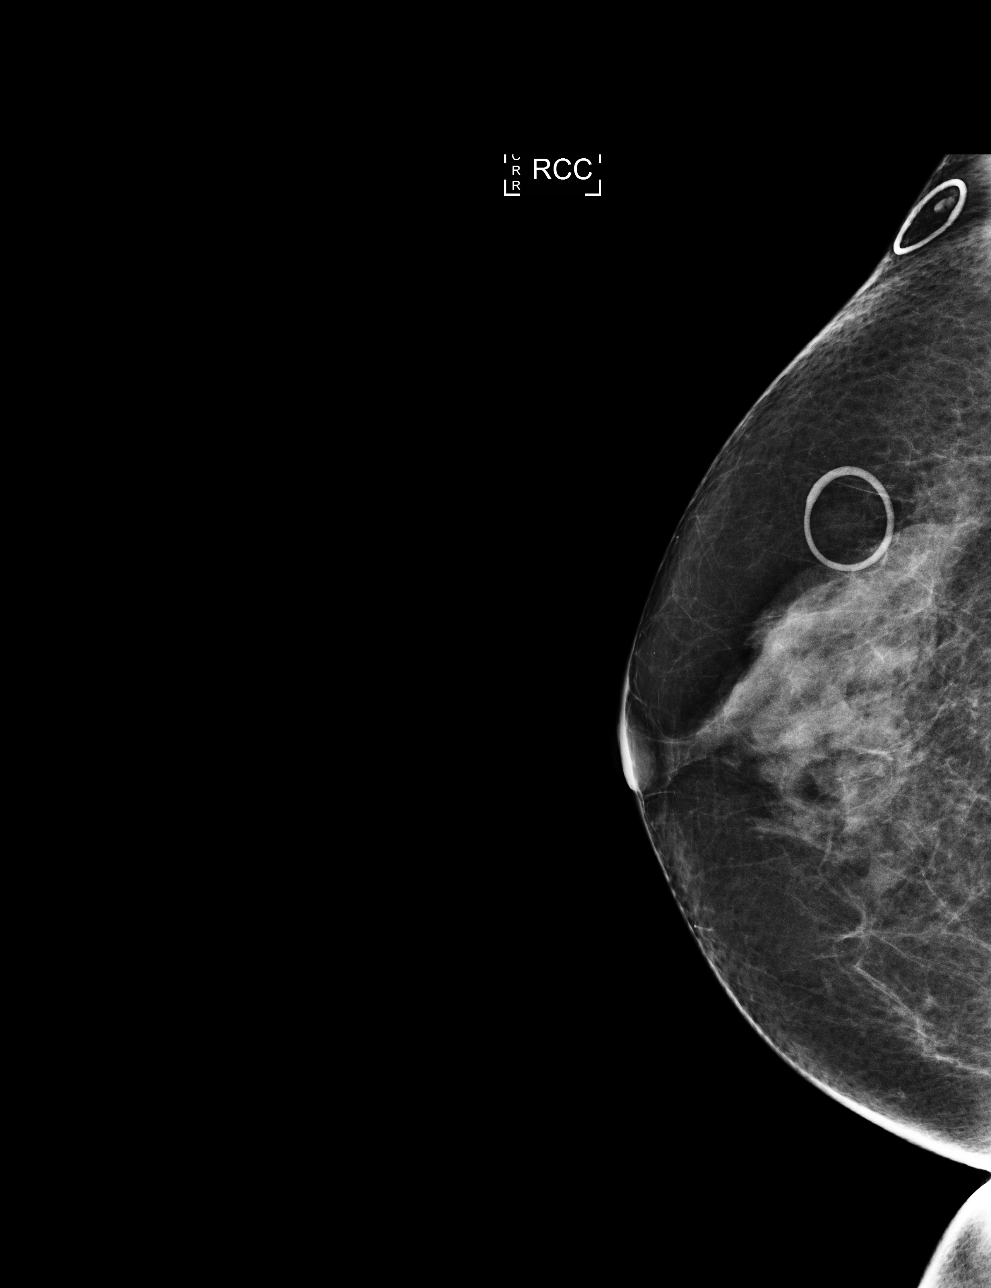

[L CC]
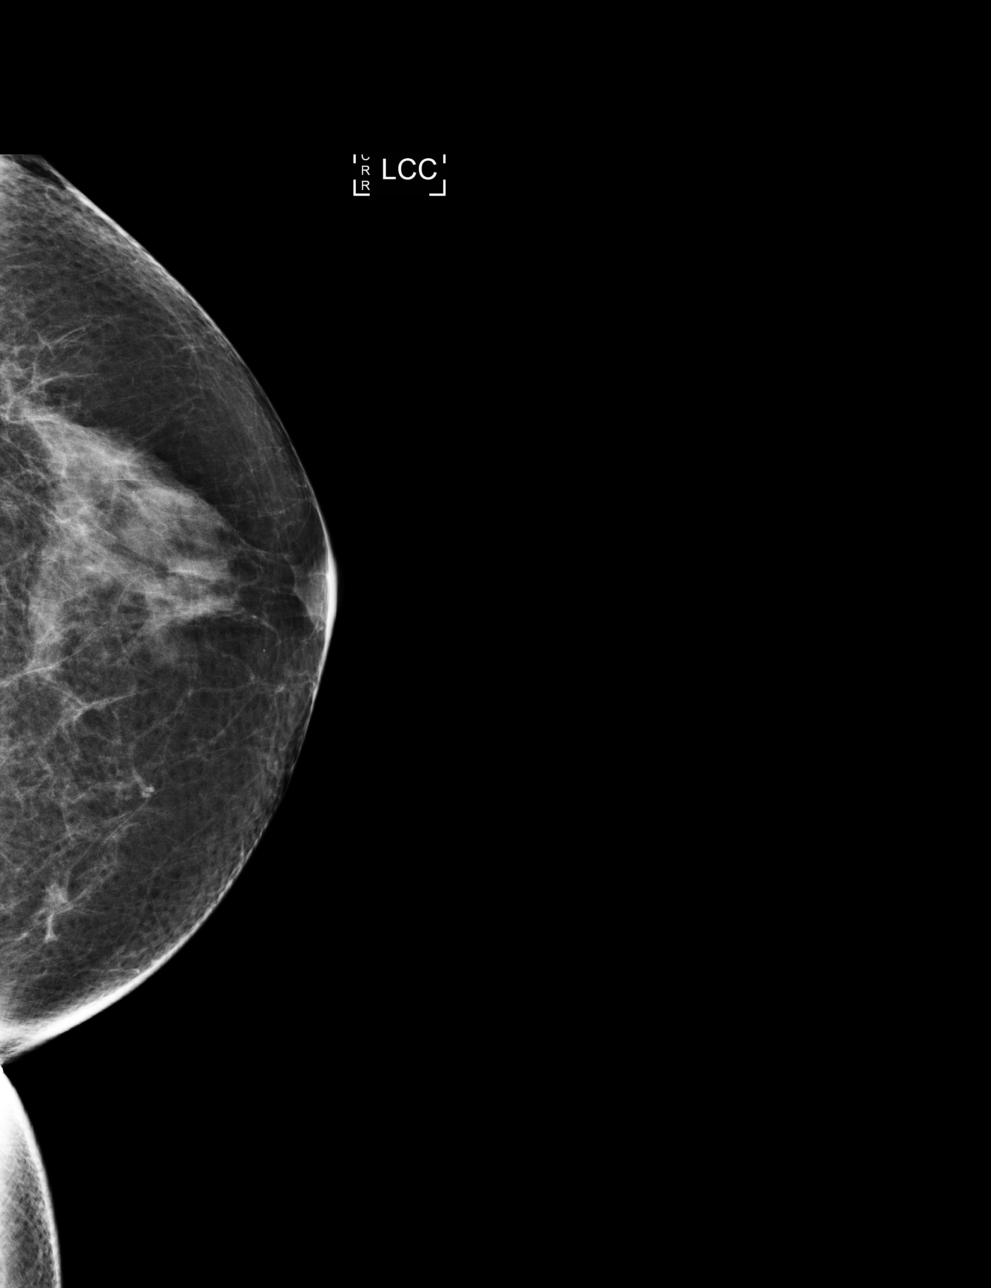

[4 of 4 positions shown; findings below may reference images not displayed]

ACR Breast Density Category c: The breast tissue is heterogeneously
dense, which may obscure small masses.
FINDINGS: There are no findings suspicious for malignancy. Images were
processed with CAD.
IMPRESSION: No mammographic evidence of malignancy. A result letter of this
screening mammogram will be mailed directly to the patient.

RECOMMENDATION:
Screening mammogram in one year. (Code:YJ-2-FEZ)

BI-RADS CATEGORY  1: Negative.

## 2018-01-22 ENCOUNTER — Ambulatory Visit (INDEPENDENT_AMBULATORY_CARE_PROVIDER_SITE_OTHER): Payer: PPO

## 2018-01-22 ENCOUNTER — Other Ambulatory Visit: Payer: Self-pay

## 2018-01-22 ENCOUNTER — Ambulatory Visit
Admission: EM | Admit: 2018-01-22 | Discharge: 2018-01-22 | Disposition: A | Discharge status: Home / Self Care | Payer: PPO | Attending: Emergency Medicine | Admitting: Emergency Medicine

## 2018-01-22 DIAGNOSIS — S5291XA Unspecified fracture of right forearm, initial encounter for closed fracture: Secondary | ICD-10-CM | POA: Diagnosis not present

## 2018-01-22 DIAGNOSIS — S52324A Nondisplaced transverse fracture of shaft of right radius, initial encounter for closed fracture: Secondary | ICD-10-CM

## 2018-01-22 NOTE — ED Triage Notes (Signed)
Patient complains of right arm pain that occurred on 08/10. Patient states that a limb had fallen and she was helping to get it up. Patient states that a limb fell on arm and she has been having pain and bruising. Patient reports that pain has been constant and worse at times. States that she was bathing her mother last night and the pain worsened again.

## 2018-01-22 NOTE — ED Provider Notes (Signed)
[facility] ____________________________________________  Time seen: Approximately 10:11 AM  I have reviewed the triage vital signs and the nursing notes.   HISTORY  Chief Complaint Arm Pain (right)   HPI Linda Gentry is a 72 y.o. female presenting for evaluation of right forearm pain since injury that occurred on August 10.  States that she had had a limb that fell down at her house and they were working outside to clean up that area.  States in the process part of the limb fell directly down on her right forearm causing pain.  States over a week the area has started to decrease in pain and get better, but reports yesterday while she was helping take care of her mother she felt pain to her right forearm again.  States when sitting still no pain.  States pain is only with twisting or direct palpation.  No pain radiation, weakness, numbness, paresthesias or other complaints.  Has taken occasional Tylenol and ibuprofen which does help.  Reports right-hand dominant.  Denies any other pain or injuries.  Reports otherwise feels well.  [facility]: PCP   Past Medical History:  Diagnosis Date  . Fibrocystic breast disease   . Hyperlipidemia   . Kidney stone   . Osteoporosis     Patient Active Problem List   Diagnosis Date Noted  . Hyperlipidemia 08/16/2014  . 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 BREAST BIOPSY Left 1965   neg  . COLONOSCOPY WITH PROPOFOL N/A 11/01/2015   Procedure: COLONOSCOPY WITH PROPOFOL;  Surgeon: Lollie Sails, MD;  Location: [facility] ENDOSCOPY;  Service: Endoscopy;  Laterality: N/A;  . CYSTOSCOPY W/ URETERAL STENT PLACEMENT Right 02/15/2016   Procedure: CYSTOSCOPY WITH RETROGRADE PYELOGRAM/URETERAL STENT PLACEMENT;  Surgeon: Nickie Retort, MD;  Location: [facility] ORS;  Service: Urology;  Laterality: Right;  . CYSTOSCOPY W/ URETERAL STENT PLACEMENT Right 03/14/2016   Procedure: CYSTOSCOPY WITH STENT REPLACEMENT;  Surgeon: Nickie Retort, MD;  Location: [facility] ORS;  Service: Urology;  Laterality: Right;  . URETEROSCOPY WITH HOLMIUM LASER LITHOTRIPSY Right 03/14/2016   Procedure: URETEROSCOPY WITH HOLMIUM LASER LITHOTRIPSY;  Surgeon: Nickie Retort, MD;  Location: [facility] ORS;  Service: Urology;  Laterality: Right;     No current facility-administered medications for this encounter.   Current Outpatient Medications:  .  Calcium-Magnesium-Vitamin D (CALCIUM 500 PO), Take 1 tablet by mouth 2 (two) times daily. , Disp: , Rfl:  .  Multiple Vitamin (MULTIVITAMIN) capsule, Take 1 capsule by mouth daily., Disp: , Rfl:  .  pravastatin (PRAVACHOL) 20 MG tablet, Take 20 mg by mouth at bedtime. , Disp: , Rfl:  .  timolol (TIMOPTIC) 0.5 % ophthalmic solution, Place 1 drop into both eyes daily., Disp: , Rfl:   Allergies Patient has no known allergies.  Family History  Problem Relation Age of Onset  . Breast cancer Neg Hx   . Kidney cancer Neg Hx   . Kidney disease Neg Hx   . Prostate cancer Neg Hx     Social History Social History   Tobacco Use  . Smoking status: Never Smoker  . Smokeless tobacco: Never Used  Substance Use Topics  . Alcohol use: No  . Drug use: No    Review of Systems Constitutional: No fever/chills Cardiovascular: Denies chest pain. Respiratory: Denies shortness of breath. Gastrointestinal: No abdominal pain.  Musculoskeletal: Negative for back pain. AS above.  Skin: Negative for rash.   ____________________________________________   PHYSICAL  EXAM:  VITAL SIGNS: ED Triage Vitals  Enc Vitals Group     BP 01/22/18 0855 129/71     Pulse Rate 01/22/18 0855 66     Resp 01/22/18 0855 18     Temp 01/22/18 0855 98.3 F (36.8 C)     Temp Source 01/22/18 0855 Oral     SpO2 01/22/18 0855 98 %     Weight 01/22/18 0852 122 lb (55.3 kg)     Height 01/22/18 0852 5\' 1"  (1.549 m)     Head Circumference --      Peak Flow --       Pain Score 01/22/18 0852 7     Pain Loc --      Pain Edu? --      Excl. in [place]? --     Constitutional: Alert and oriented. Well appearing and in no acute distress. ENT      Head: Normocephalic and atraumatic. Cardiovascular: Normal rate, regular rhythm. Grossly normal heart sounds.  Good peripheral circulation. Respiratory: Normal respiratory effort without tachypnea nor retractions. Breath sounds are clear and equal bilaterally. No wheezes, rales, rhonchi. Musculoskeletal: Steady gait.  Bilateral distal radial pulses equal and easily palpated.  Bilateral hand grip strong and equal. Except: Right mid forearm along midshaft radius mild ecchymosis and mild to moderate direct tenderness to palpation, no distal or proximal forearm tenderness, right upper extremity otherwise nontender, normal distal sensation, no motor or tendon deficit noted to right arm, mild pain with pronation and supination full range of motion present. Neurologic:  Normal speech and language.  Speech is normal. No gait instability.  Skin:  Skin is warm, dry and intact. No rash noted. Psychiatric: Mood and affect are normal. Speech and behavior are normal. Patient exhibits appropriate insight and judgment   ___________________________________________   LABS (all labs ordered are listed, but only abnormal results are displayed)  Labs Reviewed - No data to display  RADIOLOGY  Dg Forearm Right  Result Date: 01/22/2018 CLINICAL DATA:  Heavy object fell on forearm.  Pain. EXAM: RIGHT FOREARM - 2 VIEW COMPARISON:  None. FINDINGS: There is a nondisplaced fracture through the midshaft of the right radius. No ulnar abnormality. Soft tissues are intact. IMPRESSION: Nondisplaced mid right radial shaft fracture. Electronically Signed   By: Rolm Baptise M.D.   On: 01/22/2018 09:26   ____________________________________________   PROCEDURES Procedures   Long-arm anterior posterior OCL splint applied.  Sling given.  INITIAL  IMPRESSION / ASSESSMENT AND PLAN / ED COURSE  Pertinent labs & imaging results that were available during my care of the patient were reviewed by me and considered in my medical decision making (see chart for details).  Well-appearing patient.  No acute distress.  Mechanical injury that occurred on August 10 as above, right forearm pain.  Right forearm nondisplaced mid right radial shaft fracture.  Splinted as above.  Encouraged rest, ice, supportive care and follow-up with orthopedic in 1 week.  Information given.  Patient denies need for prescription pain medication.   Discussed follow up and return parameters including no resolution or any worsening concerns. Patient verbalized understanding and agreed to plan.   ____________________________________________   FINAL CLINICAL IMPRESSION(S) / ED DIAGNOSES  Final diagnoses:  Nondisplaced transverse fracture of shaft of right radius, initial encounter for closed fracture     ED Discharge Orders    None       Note: This dictation was prepared with Dragon dictation along with smaller phrase technology. Any transcriptional errors  that result from this process are unintentional.         Marylene Land, NP 01/22/18 1055

## 2018-01-22 NOTE — Discharge Instructions (Addendum)
Keep in splint. Rest arm. May continue to apply ice intermittently and elevate.  Follow-up with orthopedic in 1 week, see above to call.  Return to urgent care as needed.

## 2018-01-23 DIAGNOSIS — S52301A Unspecified fracture of shaft of right radius, initial encounter for closed fracture: Secondary | ICD-10-CM | POA: Diagnosis not present

## 2018-01-30 DIAGNOSIS — S52301A Unspecified fracture of shaft of right radius, initial encounter for closed fracture: Secondary | ICD-10-CM | POA: Diagnosis not present

## 2018-01-30 DIAGNOSIS — S52301D Unspecified fracture of shaft of right radius, subsequent encounter for closed fracture with routine healing: Secondary | ICD-10-CM | POA: Diagnosis not present

## 2018-02-11 ENCOUNTER — Other Ambulatory Visit: Payer: Self-pay

## 2018-02-11 ENCOUNTER — Ambulatory Visit
Admission: EM | Admit: 2018-02-11 | Discharge: 2018-02-11 | Disposition: A | Discharge status: Home / Self Care | Payer: PPO | Attending: Family Medicine | Admitting: Family Medicine

## 2018-02-11 ENCOUNTER — Telehealth: Payer: Self-pay | Admitting: Family Medicine

## 2018-02-11 ENCOUNTER — Encounter: Payer: Self-pay | Admitting: Emergency Medicine

## 2018-02-11 DIAGNOSIS — B029 Zoster without complications: Secondary | ICD-10-CM | POA: Diagnosis not present

## 2018-02-11 MED ORDER — TRIAMCINOLONE ACETONIDE 0.5 % EX OINT: 1.0000 "application " | TOPICAL_OINTMENT | Freq: Two times a day (BID) | CUTANEOUS | 0 refills | Status: AC

## 2018-02-11 MED ORDER — ACYCLOVIR 5 % EX OINT: TOPICAL_OINTMENT | CUTANEOUS | 1 refills | Status: AC

## 2018-02-11 MED ORDER — ACYCLOVIR 5 % EX OINT: TOPICAL_OINTMENT | CUTANEOUS | 0 refills | Status: AC

## 2018-02-11 NOTE — ED Triage Notes (Signed)
Pt c/o itchy  rash on her buttock. Started about a week ago. Has gotten worse in the past 3-4 days. She reports that she gets this intermittently ans has seen dermatology for this but the cream she was given by them is not helping this time.

## 2018-02-11 NOTE — Telephone Encounter (Signed)
Patient called and requested her medications be sent to [facility].  Prescriptions sent.

## 2018-02-11 NOTE — ED Provider Notes (Signed)
[facility]    CSN: [id number] Arrival date & time: 02/11/18  7829  History   Chief Complaint Chief Complaint  Patient presents with  . Rash    HPI  72 year old female presents with rash.  Patient reports a 1.5-week history of rash on her left buttock.  She has a history of recurrent rash in this area.  She states that she was told by dermatologist that it was herpetic in nature.  I believe she is referring to herpes zoster.  She states that she is been using a cream that she was given previously but it has not resolved.  No reports of pain.  No fevers or chills.  She does note that it is red.  She states that it started out with small "bumps".  And has now progressed to what it appears as currently.  No known exacerbating factors.  No other associated symptoms.   PMH, Surgical Hx, Family Hx, Social History reviewed and updated as below.  Past Medical History:  Diagnosis Date  . Fibrocystic breast disease   . Hyperlipidemia   . Kidney stone   . Osteoporosis     Patient Active Problem List   Diagnosis Date Noted  . Hyperlipidemia 08/16/2014  . 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 BREAST BIOPSY Left 1965   neg  . COLONOSCOPY WITH PROPOFOL N/A 11/01/2015   Procedure: COLONOSCOPY WITH PROPOFOL;  Surgeon: Lollie Sails, MD;  Location: [facility] ENDOSCOPY;  Service: Endoscopy;  Laterality: N/A;  . CYSTOSCOPY W/ URETERAL STENT PLACEMENT Right 02/15/2016   Procedure: CYSTOSCOPY WITH RETROGRADE PYELOGRAM/URETERAL STENT PLACEMENT;  Surgeon: Nickie Retort, MD;  Location: [facility] ORS;  Service: Urology;  Laterality: Right;  . CYSTOSCOPY W/ URETERAL STENT PLACEMENT Right 03/14/2016   Procedure: CYSTOSCOPY WITH STENT REPLACEMENT;  Surgeon: Nickie Retort, MD;  Location: [facility] ORS;  Service: Urology;  Laterality: Right;  . URETEROSCOPY WITH HOLMIUM LASER LITHOTRIPSY Right 03/14/2016   Procedure: URETEROSCOPY WITH  HOLMIUM LASER LITHOTRIPSY;  Surgeon: Nickie Retort, MD;  Location: [facility] ORS;  Service: Urology;  Laterality: Right;    OB History   None    Family History Family History  Problem Relation Age of Onset  . Breast cancer Neg Hx   . Kidney cancer Neg Hx   . Kidney disease Neg Hx   . Prostate cancer Neg Hx     Social History Social History   Tobacco Use  . Smoking status: Never Smoker  . Smokeless tobacco: Never Used  Substance Use Topics  . Alcohol use: No  . Drug use: No     Allergies   Patient has no known allergies.   Review of Systems Review of Systems  Constitutional: Negative.   Skin: Positive for rash.   Physical Exam Triage Vital Signs ED Triage Vitals  Enc Vitals Group     BP 02/11/18 0911 140/70     Pulse Rate 02/11/18 0911 62     Resp 02/11/18 0911 16     Temp 02/11/18 0911 98 F (36.7 C)     Temp Source 02/11/18 0911 Oral     SpO2 02/11/18 0911 98 %     Weight 02/11/18 0910 122 lb (55.3 kg)     Height 02/11/18 0910 5\' 1"  (1.549 m)     Head Circumference --      Peak Flow --      Pain Score 02/11/18 0910 0     Pain  Loc --      Pain Edu? --      Excl. in [place]? --    Updated Vital Signs BP 140/70 (BP Location: Right Arm)   Pulse 62   Temp 98 F (36.7 C) (Oral)   Resp 16   Ht 5\' 1"  (1.549 m)   Wt 55.3 kg   SpO2 98%   BMI 23.05 kg/m   Visual Acuity Right Eye Distance:   Left Eye Distance:   Bilateral Distance:    Right Eye Near:   Left Eye Near:    Bilateral Near:     Physical Exam  Constitutional: She is oriented to person, place, and time. She appears well-developed. No distress.  HENT:  Head: Normocephalic and atraumatic.  Pulmonary/Chest: Effort normal. No respiratory distress.  Neurological: She is alert and oriented to person, place, and time.  Skin:  Left buttock with erythematous, raised rash.  Appears to be healing.   Psychiatric: She has a normal mood and affect. Her behavior is normal.  Nursing note and vitals  reviewed.  UC Treatments / Results  Labs (all labs ordered are listed, but only abnormal results are displayed) Labs Reviewed - No data to display  EKG None  Radiology No results found.  Procedures Procedures (including critical care time)  Medications Ordered in UC Medications - No data to display  Initial Impression / Assessment and Plan / UC Course  I have reviewed the triage vital signs and the nursing notes.  Pertinent labs & imaging results that were available during my care of the patient were reviewed by me and considered in my medical decision making (see chart for details).    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  Treating with acyclovir ointment and triamcinolone ointment.  Supportive care.  Final Clinical Impressions(s) / UC Diagnoses   Final diagnoses:  Herpes zoster without complication   Discharge Instructions   None    ED Prescriptions    Medication Sig Dispense Auth. Provider   acyclovir ointment (ZOVIRAX) 5 % Apply to affect area 5 times daily until gone. 30 g Danh Bayus G, DO   triamcinolone ointment (KENALOG) 0.5 % Apply 1 application topically 2 (two) times daily. 30 g Coral Spikes, DO     Controlled Substance Prescriptions [place] Controlled Substance Registry consulted? Not Applicable   Coral Spikes, DO 02/11/18 1103

## 2018-02-12 DIAGNOSIS — S52301D Unspecified fracture of shaft of right radius, subsequent encounter for closed fracture with routine healing: Secondary | ICD-10-CM | POA: Diagnosis not present

## 2018-03-07 DIAGNOSIS — H401131 Primary open-angle glaucoma, bilateral, mild stage: Secondary | ICD-10-CM | POA: Diagnosis not present

## 2018-03-07 DIAGNOSIS — H2513 Age-related nuclear cataract, bilateral: Secondary | ICD-10-CM | POA: Diagnosis not present

## 2018-08-28 DIAGNOSIS — Z0001 Encounter for general adult medical examination with abnormal findings: Secondary | ICD-10-CM | POA: Diagnosis not present

## 2018-08-28 DIAGNOSIS — E7800 Pure hypercholesterolemia, unspecified: Secondary | ICD-10-CM | POA: Diagnosis not present

## 2018-09-04 DIAGNOSIS — M545 Low back pain: Secondary | ICD-10-CM | POA: Diagnosis not present

## 2018-09-04 DIAGNOSIS — M25552 Pain in left hip: Secondary | ICD-10-CM | POA: Diagnosis not present

## 2018-09-04 DIAGNOSIS — Z0000 Encounter for general adult medical examination without abnormal findings: Secondary | ICD-10-CM | POA: Diagnosis not present

## 2018-09-04 DIAGNOSIS — I509 Heart failure, unspecified: Secondary | ICD-10-CM | POA: Diagnosis not present

## 2018-09-04 DIAGNOSIS — E7800 Pure hypercholesterolemia, unspecified: Secondary | ICD-10-CM | POA: Diagnosis not present

## 2018-09-04 DIAGNOSIS — I5043 Acute on chronic combined systolic (congestive) and diastolic (congestive) heart failure: Secondary | ICD-10-CM | POA: Diagnosis not present

## 2018-09-04 DIAGNOSIS — R600 Localized edema: Secondary | ICD-10-CM | POA: Diagnosis not present

## 2018-09-04 DIAGNOSIS — M810 Age-related osteoporosis without current pathological fracture: Secondary | ICD-10-CM | POA: Diagnosis not present

## 2018-09-04 DIAGNOSIS — M25551 Pain in right hip: Secondary | ICD-10-CM | POA: Diagnosis not present

## 2018-09-04 DIAGNOSIS — N179 Acute kidney failure, unspecified: Secondary | ICD-10-CM | POA: Diagnosis not present

## 2018-09-04 DIAGNOSIS — Z0001 Encounter for general adult medical examination with abnormal findings: Secondary | ICD-10-CM | POA: Diagnosis not present

## 2018-10-08 IMAGING — MG MM DIGITAL SCREENING BILAT W/ CAD
4 series · 4 of 4 positions shown · non-contrast
Comparison: Previous exam(s).

CLINICAL DATA: Screening.

EXAM:
DIGITAL SCREENING BILATERAL MAMMOGRAM WITH CAD

[L MLO]
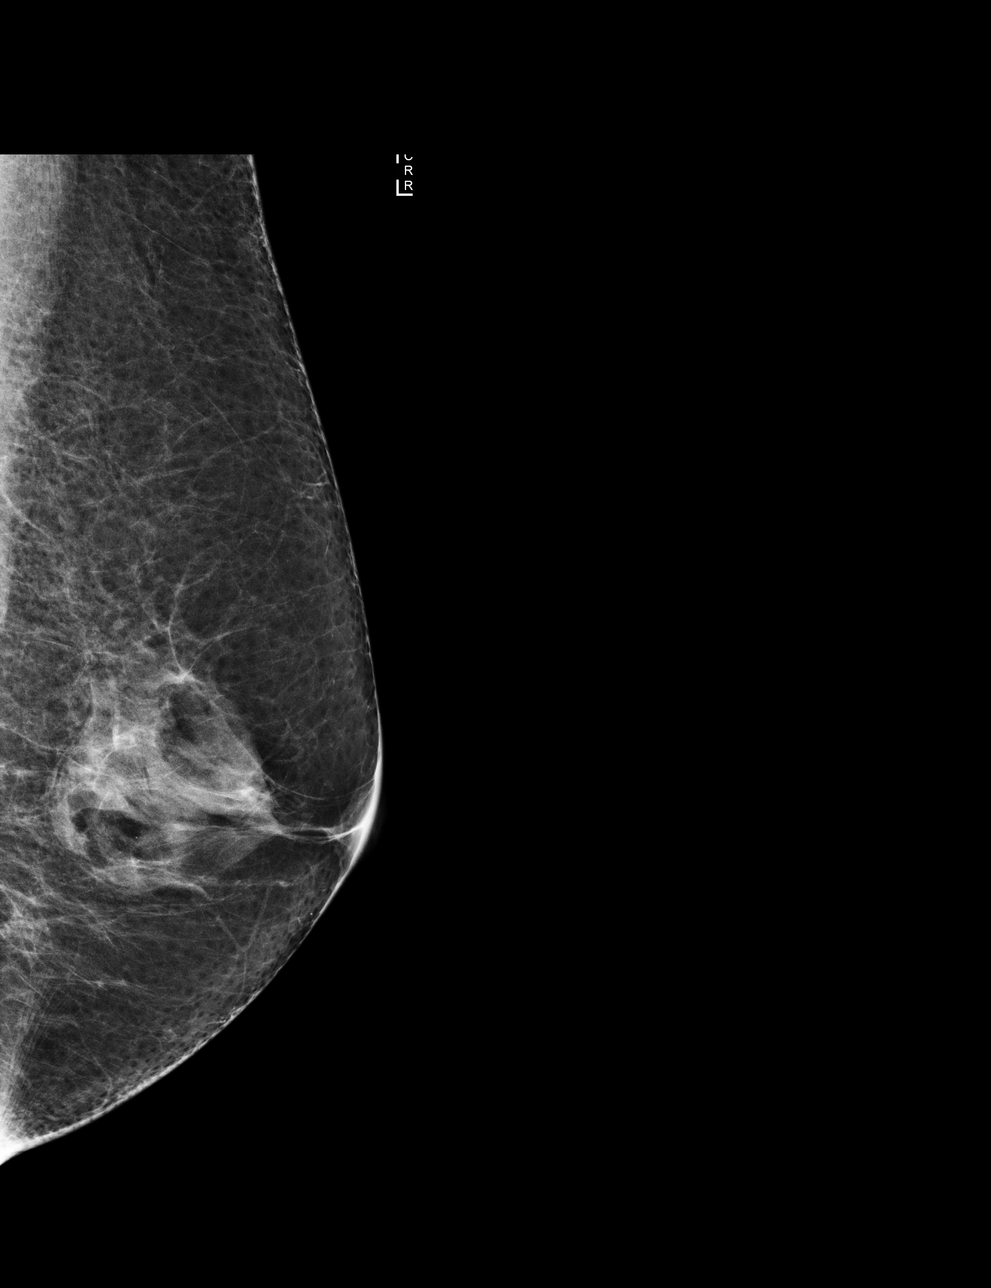

[R CC]
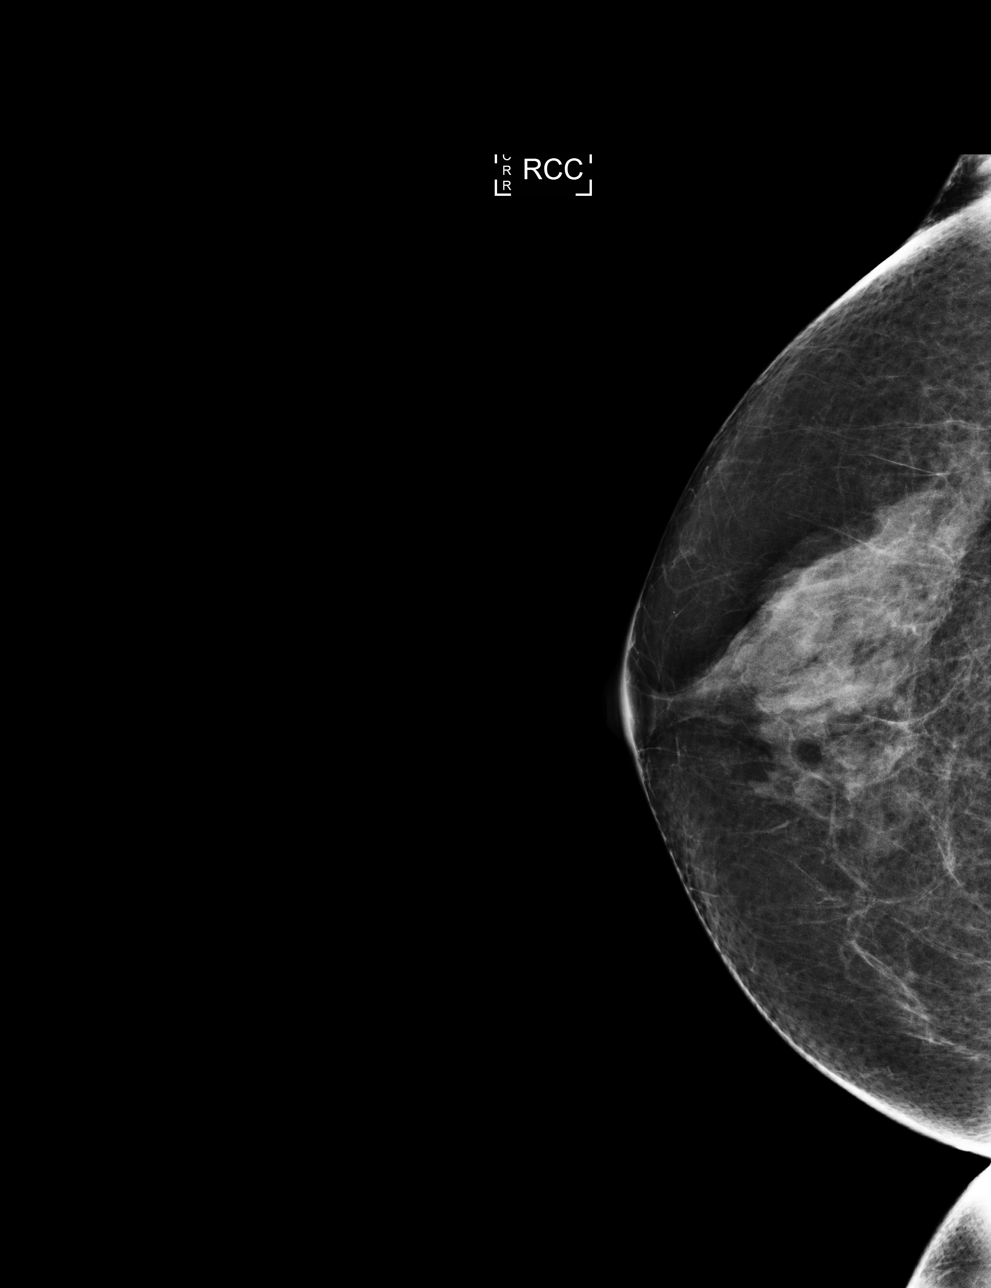

[L CC]
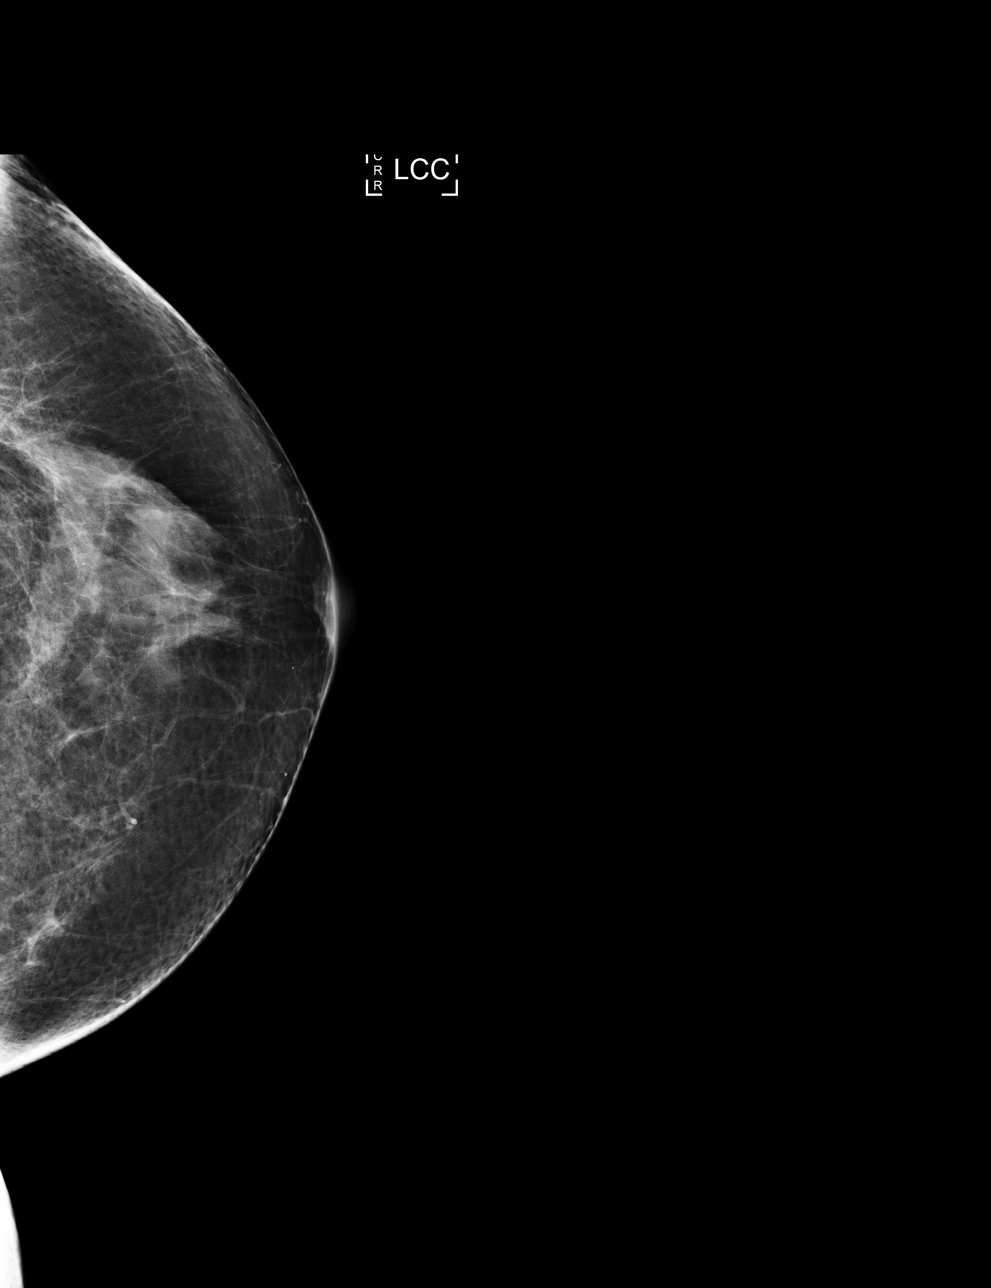

[R MLO]
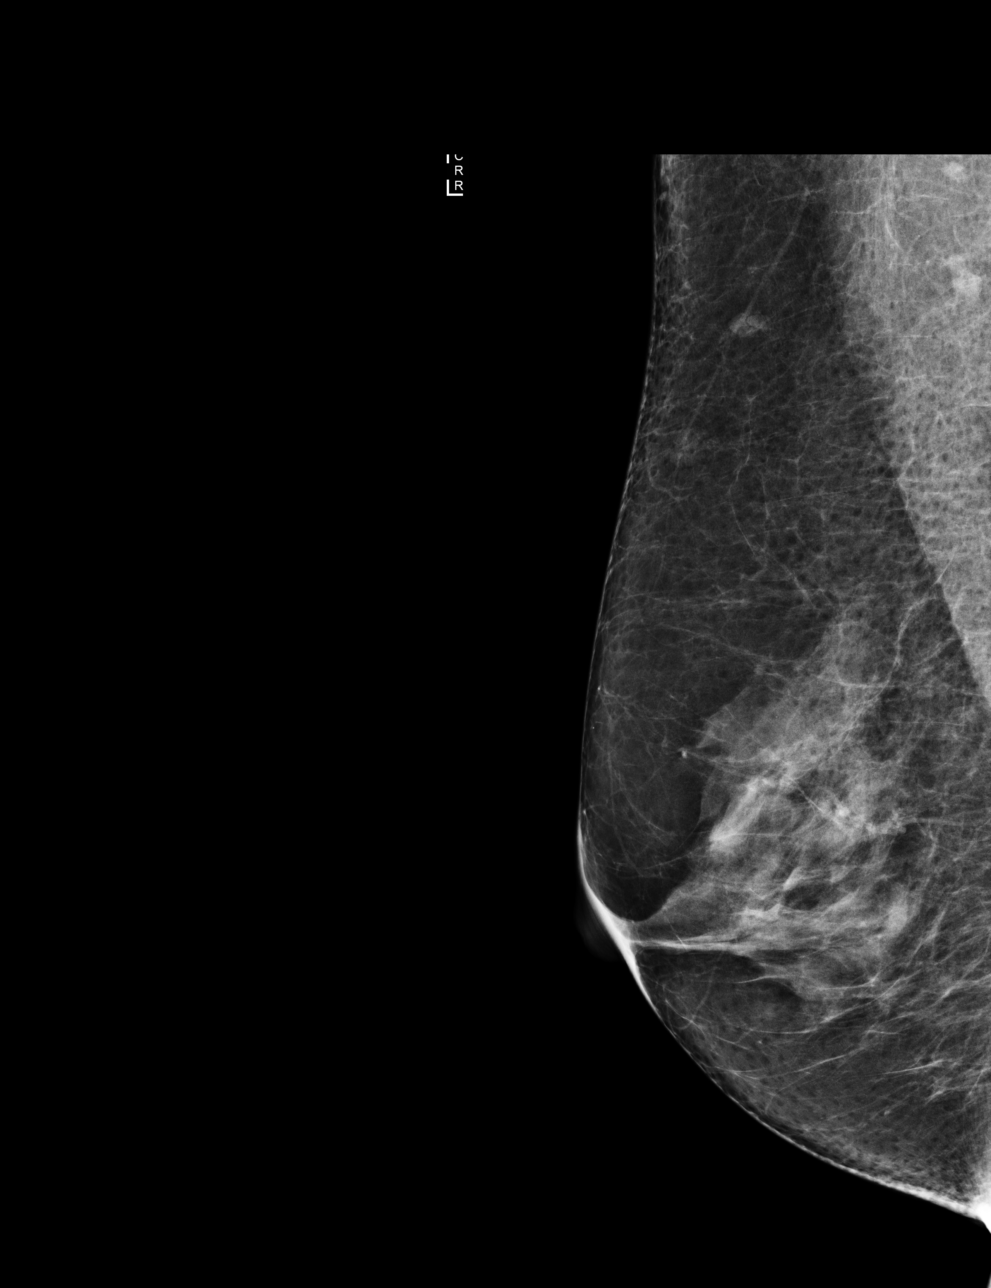

[4 of 4 positions shown; findings below may reference images not displayed]

ACR Breast Density Category c: The breast tissue is heterogeneously
dense, which may obscure small masses.
FINDINGS: There are no findings suspicious for malignancy. Images were
processed with CAD.
IMPRESSION: No mammographic evidence of malignancy. A result letter of this
screening mammogram will be mailed directly to the patient.

RECOMMENDATION:
Screening mammogram in one year. (Code:[id number])

BI-RADS CATEGORY  1: Negative.

## 2018-10-31 ENCOUNTER — Other Ambulatory Visit: Payer: Self-pay | Admitting: Internal Medicine

## 2018-10-31 DIAGNOSIS — Z1231 Encounter for screening mammogram for malignant neoplasm of breast: Secondary | ICD-10-CM

## 2018-11-18 ENCOUNTER — Ambulatory Visit
Admission: RE | Admit: 2018-11-18 | Discharge: 2018-11-18 | Disposition: A | Discharge status: Home / Self Care | Payer: PPO | Source: Ambulatory Visit | Attending: Internal Medicine | Admitting: Internal Medicine

## 2018-11-18 ENCOUNTER — Other Ambulatory Visit: Payer: Self-pay

## 2018-11-18 DIAGNOSIS — Z1231 Encounter for screening mammogram for malignant neoplasm of breast: Secondary | ICD-10-CM | POA: Diagnosis not present

## 2019-09-03 DIAGNOSIS — E7800 Pure hypercholesterolemia, unspecified: Secondary | ICD-10-CM | POA: Diagnosis not present

## 2019-09-03 DIAGNOSIS — Z0001 Encounter for general adult medical examination with abnormal findings: Secondary | ICD-10-CM | POA: Diagnosis not present

## 2019-09-10 DIAGNOSIS — N3001 Acute cystitis with hematuria: Secondary | ICD-10-CM | POA: Diagnosis not present

## 2019-09-10 DIAGNOSIS — M810 Age-related osteoporosis without current pathological fracture: Secondary | ICD-10-CM | POA: Diagnosis not present

## 2019-09-10 DIAGNOSIS — Z0000 Encounter for general adult medical examination without abnormal findings: Secondary | ICD-10-CM | POA: Diagnosis not present

## 2019-09-10 DIAGNOSIS — Z0001 Encounter for general adult medical examination with abnormal findings: Secondary | ICD-10-CM | POA: Diagnosis not present

## 2019-09-10 DIAGNOSIS — E7800 Pure hypercholesterolemia, unspecified: Secondary | ICD-10-CM | POA: Diagnosis not present

## 2019-09-28 DIAGNOSIS — H401131 Primary open-angle glaucoma, bilateral, mild stage: Secondary | ICD-10-CM | POA: Diagnosis not present

## 2019-09-28 DIAGNOSIS — H40013 Open angle with borderline findings, low risk, bilateral: Secondary | ICD-10-CM | POA: Diagnosis not present

## 2019-09-28 DIAGNOSIS — H2513 Age-related nuclear cataract, bilateral: Secondary | ICD-10-CM | POA: Diagnosis not present

## 2019-10-08 ENCOUNTER — Other Ambulatory Visit: Payer: Self-pay | Admitting: Internal Medicine

## 2019-10-08 DIAGNOSIS — Z1231 Encounter for screening mammogram for malignant neoplasm of breast: Secondary | ICD-10-CM

## 2019-11-24 ENCOUNTER — Ambulatory Visit
Admission: RE | Admit: 2019-11-24 | Discharge: 2019-11-24 | Disposition: A | Discharge status: Home / Self Care | Payer: PPO | Source: Ambulatory Visit | Attending: Internal Medicine | Admitting: Internal Medicine

## 2019-11-24 ENCOUNTER — Other Ambulatory Visit: Payer: Self-pay

## 2019-11-24 DIAGNOSIS — Z1231 Encounter for screening mammogram for malignant neoplasm of breast: Secondary | ICD-10-CM | POA: Diagnosis not present

## 2020-04-01 DIAGNOSIS — H401131 Primary open-angle glaucoma, bilateral, mild stage: Secondary | ICD-10-CM | POA: Diagnosis not present

## 2020-04-01 DIAGNOSIS — H2513 Age-related nuclear cataract, bilateral: Secondary | ICD-10-CM | POA: Diagnosis not present

## 2020-09-08 DIAGNOSIS — Z0001 Encounter for general adult medical examination with abnormal findings: Secondary | ICD-10-CM | POA: Diagnosis not present

## 2020-09-08 DIAGNOSIS — E7800 Pure hypercholesterolemia, unspecified: Secondary | ICD-10-CM | POA: Diagnosis not present

## 2020-09-15 DIAGNOSIS — Z0001 Encounter for general adult medical examination with abnormal findings: Secondary | ICD-10-CM | POA: Diagnosis not present

## 2020-09-15 DIAGNOSIS — E7800 Pure hypercholesterolemia, unspecified: Secondary | ICD-10-CM | POA: Diagnosis not present

## 2020-09-15 DIAGNOSIS — M810 Age-related osteoporosis without current pathological fracture: Secondary | ICD-10-CM | POA: Diagnosis not present

## 2020-09-15 DIAGNOSIS — Z0000 Encounter for general adult medical examination without abnormal findings: Secondary | ICD-10-CM | POA: Diagnosis not present

## 2020-09-27 DIAGNOSIS — H2513 Age-related nuclear cataract, bilateral: Secondary | ICD-10-CM | POA: Diagnosis not present

## 2020-09-27 DIAGNOSIS — H401131 Primary open-angle glaucoma, bilateral, mild stage: Secondary | ICD-10-CM | POA: Diagnosis not present

## 2020-10-28 ENCOUNTER — Other Ambulatory Visit: Payer: Self-pay | Admitting: Internal Medicine

## 2020-10-28 DIAGNOSIS — Z1231 Encounter for screening mammogram for malignant neoplasm of breast: Secondary | ICD-10-CM

## 2020-11-24 ENCOUNTER — Ambulatory Visit
Admission: RE | Admit: 2020-11-24 | Discharge: 2020-11-24 | Disposition: A | Discharge status: Home / Self Care | Payer: PPO | Source: Ambulatory Visit | Attending: Internal Medicine | Admitting: Internal Medicine

## 2020-11-24 ENCOUNTER — Other Ambulatory Visit: Payer: Self-pay

## 2020-11-24 DIAGNOSIS — Z1231 Encounter for screening mammogram for malignant neoplasm of breast: Secondary | ICD-10-CM | POA: Insufficient documentation

## 2020-12-08 ENCOUNTER — Other Ambulatory Visit: Payer: Self-pay | Admitting: Internal Medicine

## 2020-12-08 DIAGNOSIS — R928 Other abnormal and inconclusive findings on diagnostic imaging of breast: Secondary | ICD-10-CM

## 2020-12-08 DIAGNOSIS — N6489 Other specified disorders of breast: Secondary | ICD-10-CM

## 2020-12-15 ENCOUNTER — Other Ambulatory Visit: Payer: Self-pay

## 2020-12-15 ENCOUNTER — Ambulatory Visit
Admission: RE | Admit: 2020-12-15 | Discharge: 2020-12-15 | Disposition: A | Discharge status: Home / Self Care | Payer: PPO | Source: Ambulatory Visit | Attending: Internal Medicine | Admitting: Internal Medicine

## 2020-12-15 DIAGNOSIS — R928 Other abnormal and inconclusive findings on diagnostic imaging of breast: Secondary | ICD-10-CM | POA: Diagnosis not present

## 2020-12-15 DIAGNOSIS — R922 Inconclusive mammogram: Secondary | ICD-10-CM | POA: Diagnosis not present

## 2020-12-15 DIAGNOSIS — N6489 Other specified disorders of breast: Secondary | ICD-10-CM

## 2021-02-21 DIAGNOSIS — Z1211 Encounter for screening for malignant neoplasm of colon: Secondary | ICD-10-CM | POA: Diagnosis not present

## 2021-02-21 DIAGNOSIS — L989 Disorder of the skin and subcutaneous tissue, unspecified: Secondary | ICD-10-CM | POA: Diagnosis not present

## 2021-02-28 DIAGNOSIS — L858 Other specified epidermal thickening: Secondary | ICD-10-CM | POA: Diagnosis not present

## 2021-02-28 DIAGNOSIS — L989 Disorder of the skin and subcutaneous tissue, unspecified: Secondary | ICD-10-CM | POA: Diagnosis not present

## 2021-03-27 DIAGNOSIS — J029 Acute pharyngitis, unspecified: Secondary | ICD-10-CM | POA: Diagnosis not present

## 2021-04-20 DIAGNOSIS — H2513 Age-related nuclear cataract, bilateral: Secondary | ICD-10-CM | POA: Diagnosis not present

## 2021-04-20 DIAGNOSIS — H401131 Primary open-angle glaucoma, bilateral, mild stage: Secondary | ICD-10-CM | POA: Diagnosis not present

## 2021-05-03 DIAGNOSIS — Z86010 Personal history of colonic polyps: Secondary | ICD-10-CM | POA: Diagnosis not present

## 2021-07-21 ENCOUNTER — Ambulatory Visit: Payer: PPO | Admitting: Certified Registered"

## 2021-07-21 ENCOUNTER — Ambulatory Visit
Admission: RE | Admit: 2021-07-21 | Discharge: 2021-07-21 | Disposition: A | Discharge status: Home / Self Care | Payer: PPO | Attending: Gastroenterology | Admitting: Gastroenterology

## 2021-07-21 ENCOUNTER — Encounter: Payer: Self-pay | Admitting: *Deleted

## 2021-07-21 ENCOUNTER — Encounter
Admission: RE | Disposition: A | Discharge status: Home / Self Care | Payer: Self-pay | Source: Home / Self Care | Attending: Gastroenterology

## 2021-07-21 ENCOUNTER — Other Ambulatory Visit: Payer: Self-pay

## 2021-07-21 DIAGNOSIS — Z86010 Personal history of colonic polyps: Secondary | ICD-10-CM | POA: Insufficient documentation

## 2021-07-21 DIAGNOSIS — Z79899 Other long term (current) drug therapy: Secondary | ICD-10-CM | POA: Diagnosis not present

## 2021-07-21 DIAGNOSIS — D120 Benign neoplasm of cecum: Secondary | ICD-10-CM | POA: Diagnosis not present

## 2021-07-21 DIAGNOSIS — K5730 Diverticulosis of large intestine without perforation or abscess without bleeding: Secondary | ICD-10-CM | POA: Insufficient documentation

## 2021-07-21 DIAGNOSIS — D123 Benign neoplasm of transverse colon: Secondary | ICD-10-CM | POA: Insufficient documentation

## 2021-07-21 DIAGNOSIS — D125 Benign neoplasm of sigmoid colon: Secondary | ICD-10-CM | POA: Insufficient documentation

## 2021-07-21 DIAGNOSIS — Z1211 Encounter for screening for malignant neoplasm of colon: Secondary | ICD-10-CM | POA: Insufficient documentation

## 2021-07-21 DIAGNOSIS — Z87891 Personal history of nicotine dependence: Secondary | ICD-10-CM | POA: Insufficient documentation

## 2021-07-21 DIAGNOSIS — K635 Polyp of colon: Secondary | ICD-10-CM | POA: Diagnosis not present

## 2021-07-21 DIAGNOSIS — K641 Second degree hemorrhoids: Secondary | ICD-10-CM | POA: Diagnosis not present

## 2021-07-21 DIAGNOSIS — D126 Benign neoplasm of colon, unspecified: Secondary | ICD-10-CM | POA: Diagnosis not present

## 2021-07-21 HISTORY — PX: COLONOSCOPY: SHX5424

## 2021-07-21 HISTORY — DX: Personal history of urinary calculi: Z87.442

## 2021-07-21 SURGERY — COLONOSCOPY
Anesthesia: General

## 2021-07-21 MED ORDER — PROPOFOL 500 MG/50ML IV EMUL
INTRAVENOUS | Status: DC | PRN
  Administered 2021-07-21: 100 ug/kg/min via INTRAVENOUS

## 2021-07-21 MED ORDER — SODIUM CHLORIDE 0.9 % IV SOLN: INTRAVENOUS | Status: DC

## 2021-07-21 MED ORDER — PROPOFOL 10 MG/ML IV BOLUS
INTRAVENOUS | Status: DC | PRN
  Administered 2021-07-21: 100 mg via INTRAVENOUS

## 2021-07-21 NOTE — Transfer of Care (Addendum)
Immediate Anesthesia Transfer of Care Note  Patient: Linda Gentry  Procedure(s) Performed: COLONOSCOPY  Patient Location: PACU  Anesthesia Type:General  Level of Consciousness: awake  Airway & Oxygen Therapy: Patient Spontanous Breathing  Post-op Assessment: Report given to RN  Post vital signs: stable  Last Vitals:  Vitals Value Taken Time  BP    Temp 36.1 C 07/21/21 1421  Pulse    Resp    SpO2      Last Pain:  Vitals:   07/21/21 1321  TempSrc: Temporal  PainSc: 0-No pain         Complications: No notable events documented.

## 2021-07-21 NOTE — Interval H&P Note (Signed)
History and Physical Interval Note:  07/21/2021 1:49 PM  Linda Gentry  has presented today for surgery, with the diagnosis of hx of adenomatous polyp of colon.  The various methods of treatment have been discussed with the patient and family. After consideration of risks, benefits and other options for treatment, the patient has consented to  Procedure(s): COLONOSCOPY (N/A) as a surgical intervention.  The patient's history has been reviewed, patient examined, no change in status, stable for surgery.  I have reviewed the patient's chart and labs.  Questions were answered to the patient's satisfaction.     [name]  Ok to proceed with colonoscopy

## 2021-07-21 NOTE — Anesthesia Postprocedure Evaluation (Signed)
Anesthesia Post Note  Patient: EDYN POPOCA  Procedure(s) Performed: COLONOSCOPY  Patient location during evaluation: PACU Anesthesia Type: General Level of consciousness: awake and alert, oriented and patient cooperative Pain management: pain level controlled Vital Signs Assessment: post-procedure vital signs reviewed and stable Respiratory status: spontaneous breathing, nonlabored ventilation and respiratory function stable Cardiovascular status: blood pressure returned to baseline and stable Postop Assessment: adequate PO intake Anesthetic complications: no   No notable events documented.   Last Vitals:  Vitals:   07/21/21 1432 07/21/21 1442  BP: (!) 151/80 (!) 153/83  Pulse: 87 73  Resp: (!) 23 17  Temp:    SpO2: 98% 100%    Last Pain:  Vitals:   07/21/21 1442  TempSrc:   PainSc: 0-No pain                 Darrin Nipper

## 2021-07-21 NOTE — Anesthesia Postprocedure Evaluation (Deleted)
Anesthesia Post Note  Patient: DENORA WYSOCKI  Procedure(s) Performed: COLONOSCOPY  Patient location during evaluation: PACU Anesthesia Type: General Level of consciousness: awake and alert, oriented and patient cooperative Pain management: pain level controlled Vital Signs Assessment: post-procedure vital signs reviewed and stable Respiratory status: spontaneous breathing, nonlabored ventilation and respiratory function stable Cardiovascular status: blood pressure returned to baseline and stable Postop Assessment: adequate PO intake Anesthetic complications: no   No notable events documented.   Last Vitals:  Vitals:   07/21/21 1432 07/21/21 1442  BP: (!) 151/80 (!) 153/83  Pulse: 87 73  Resp: (!) 23 17  Temp:    SpO2: 98% 100%    Last Pain:  Vitals:   07/21/21 1442  TempSrc:   PainSc: 0-No pain                 Darrin Nipper

## 2021-07-21 NOTE — Op Note (Signed)
[facility] Gastroenterology Patient Name: Linda Gentry Procedure Date: 07/21/2021 1:46 PM MRN: [id number] Account #: [id number] Date of Birth: Mar 11, 1946 Admit Type: Outpatient Age: 76 Room: [facility] ENDO ROOM 3 Gender: Female Note Status: Finalized Instrument Name: Jasper Riling [id number] Procedure:             Colonoscopy Indications:           High risk colon cancer surveillance: Personal history                         of non-advanced adenoma, Last colonoscopy 5 years ago Providers:             Andrey Farmer MD, MD Referring MD:          Andres Labrum, MD (Referring MD) Medicines:             Monitored Anesthesia Care Complications:         No immediate complications. Estimated blood loss:                         Minimal. Procedure:             Pre-Anesthesia Assessment:                        - Prior to the procedure, a History and Physical was                         performed, and patient medications and allergies were                         reviewed. The patient is competent. The risks and                         benefits of the procedure and the sedation options and                         risks were discussed with the patient. All questions                         were answered and informed consent was obtained.                         Patient identification and proposed procedure were                         verified by the physician, the nurse, the                         anesthesiologist, the anesthetist and the technician                         in the endoscopy suite. Mental Status Examination:                         alert and oriented. Airway Examination: normal                         oropharyngeal airway and neck mobility. Respiratory  Examination: clear to auscultation. CV Examination:                         normal. Prophylactic Antibiotics: The patient does not                         require prophylactic  antibiotics. Prior                         Anticoagulants: The patient has taken no previous                         anticoagulant or antiplatelet agents. ASA Grade                         Assessment: II - A patient with mild systemic disease.                         After reviewing the risks and benefits, the patient                         was deemed in satisfactory condition to undergo the                         procedure. The anesthesia plan was to use monitored                         anesthesia care (MAC). Immediately prior to                         administration of medications, the patient was                         re-assessed for adequacy to receive sedatives. The                         heart rate, respiratory rate, oxygen saturations,                         blood pressure, adequacy of pulmonary ventilation, and                         response to care were monitored throughout the                         procedure. The physical status of the patient was                         re-assessed after the procedure.                        After obtaining informed consent, the colonoscope was                         passed under direct vision. Throughout the procedure,                         the patient's blood pressure, pulse, and oxygen  saturations were monitored continuously. The                         Colonoscope was introduced through the anus and                         advanced to the the cecum, identified by appendiceal                         orifice and ileocecal valve. The colonoscopy was                         performed without difficulty. The patient tolerated                         the procedure well. The quality of the bowel                         preparation was good. Findings:      The perianal and digital rectal examinations were normal.      A 7 mm polyp was found in the cecum. The polyp was sessile. The polyp       was removed with  a cold snare. Resection and retrieval were complete.       Estimated blood loss was minimal. To prevent bleeding post-intervention,       two hemostatic clips were successfully placed. There was no bleeding       during, or at the end, of the procedure.      A 3 mm polyp was found in the hepatic flexure. The polyp was sessile.       The polyp was removed with a cold snare. Resection and retrieval were       complete. Estimated blood loss was minimal.      A 4 mm polyp was found in the sigmoid colon. The polyp was sessile. The       polyp was removed with a cold snare. Resection and retrieval were       complete. Estimated blood loss was minimal.      Multiple small and large-mouthed diverticula were found in the sigmoid       colon, descending colon, transverse colon and ascending colon.      Internal hemorrhoids were found during retroflexion. The hemorrhoids       were Grade II (internal hemorrhoids that prolapse but reduce       spontaneously).      The exam was otherwise without abnormality on direct and retroflexion       views. Impression:            - One 7 mm polyp in the cecum, removed with a cold                         snare. Resected and retrieved. Clips were placed.                        - One 3 mm polyp at the hepatic flexure, removed with                         a cold snare. Resected and retrieved.                        -  One 4 mm polyp in the sigmoid colon, removed with a                         cold snare. Resected and retrieved.                        - Diverticulosis in the sigmoid colon, in the                         descending colon, in the transverse colon and in the                         ascending colon.                        - Internal hemorrhoids.                        - The examination was otherwise normal on direct and                         retroflexion views. Recommendation:        - Discharge patient to home.                        - Resume  previous diet.                        - Continue present medications.                        - Await pathology results.                        - Repeat colonoscopy is not recommended due to current                         age (34 years or older) for surveillance.                        - Return to referring physician as previously                         scheduled. Procedure Code(s):     --- Professional ---                        [phone], Colonoscopy, flexible; with removal of                         tumor(s), polyp(s), or other lesion(s) by snare                         technique Diagnosis Code(s):     --- Professional ---                        Z86.010, Personal history of colonic polyps                        K63.5, Polyp of colon  K64.1, Second degree hemorrhoids                        K57.30, Diverticulosis of large intestine without                         perforation or abscess without bleeding CPT copyright 2019 American Medical Association. All rights reserved. The codes documented in this report are preliminary and upon coder review may  be revised to meet current compliance requirements. Andrey Farmer MD, MD 07/21/2021 2:23:10 PM Number of Addenda: 0 Note Initiated On: 07/21/2021 1:46 PM Scope Withdrawal Time: 0 hours 17 minutes 43 seconds  Total Procedure Duration: 0 hours 22 minutes 49 seconds  Estimated Blood Loss:  Estimated blood loss was minimal.      [facility]

## 2021-07-21 NOTE — H&P (Signed)
Outpatient short stay form Pre-procedure 07/21/2021  [name], MD  Primary Physician: Baxter Hire, MD  Reason for visit:  Surveillance  History of present illness:    76 y/o lady with history of polyp on colonoscopy in 2012 but normal colon in 2017. No blood thinners. No family history of GI malignancies. No significant abdominal surgeries. No new symptoms.    Current Facility-Administered Medications:    0.9 %  sodium chloride infusion, , Intravenous, Continuous, Stace Peace, Hilton Cork, MD, Last Rate: 20 mL/hr at 07/21/21 1335, New Bag at 07/21/21 1335  Medications Prior to Admission  Medication Sig Dispense Refill Last Dose   acyclovir ointment (ZOVIRAX) 5 % Apply to affect area 5 times daily until gone. 30 g 1 Past Week   acyclovir ointment (ZOVIRAX) 5 % Apply to affected area 5 times daily. 30 g 0 Past Week   Calcium-Magnesium-Vitamin D (CALCIUM 500 PO) Take 1 tablet by mouth 2 (two) times daily.    Past Week   Multiple Vitamin (MULTIVITAMIN) capsule Take 1 capsule by mouth daily.   Past Week   pravastatin (PRAVACHOL) 20 MG tablet Take 20 mg by mouth at bedtime.    07/20/2021 at 2000   timolol (TIMOPTIC) 0.5 % ophthalmic solution Place 1 drop into both eyes daily.   07/20/2021   triamcinolone ointment (KENALOG) 0.5 % Apply 1 application topically 2 (two) times daily. 30 g 0 Past Week   triamcinolone ointment (KENALOG) 0.5 % Apply 1 application topically 2 (two) times daily. 30 g 0 Past Week     No Known Allergies   Past Medical History:  Diagnosis Date   Fibrocystic breast disease    History of kidney stones    Hyperlipidemia    Kidney stone    Osteoporosis     Review of systems:  Otherwise negative.    Physical Exam  Gen: Alert, oriented. Appears stated age.  HEENT: PERRLA. Lungs: No respiratory distress CV: RRR Abd: soft, benign, no masses Ext: No edema    Planned procedures: Proceed with colonoscopy. The patient understands the nature of the  planned procedure, indications, risks, alternatives and potential complications including but not limited to bleeding, infection, perforation, damage to internal organs and possible oversedation/side effects from anesthesia. The patient agrees and gives consent to proceed.  Please refer to procedure notes for findings, recommendations and patient disposition/instructions.     [name], MD [facility] Gastroenterology

## 2021-07-21 NOTE — Anesthesia Preprocedure Evaluation (Signed)
Anesthesia Evaluation  Patient identified by MRN, date of birth, ID band Patient awake    Reviewed: Allergy & Precautions, NPO status , Patient's Chart, lab work & pertinent test results  History of Anesthesia Complications Negative for: history of anesthetic complications  Airway Mallampati: II  TM Distance: >3 FB Neck ROM: Full    Dental no notable dental hx. (+) Teeth Intact   Pulmonary neg pulmonary ROS, neg sleep apnea, neg COPD, Patient abstained from smoking.Not current smoker,    Pulmonary exam normal breath sounds clear to auscultation       Cardiovascular Exercise Tolerance: Good METS(-) hypertension(-) CAD and (-) Past MI negative cardio ROS  (-) dysrhythmias  Rhythm:Regular Rate:Normal - Systolic murmurs    Neuro/Psych negative neurological ROS  negative psych ROS   GI/Hepatic negative GI ROS, Neg liver ROS, neg GERD  ,  Endo/Other  negative endocrine ROSneg diabetes  Renal/GU negative Renal ROS     Musculoskeletal   Abdominal Normal abdominal exam  (+)   Peds negative pediatric ROS (+)  Hematology negative hematology ROS (+)   Anesthesia Other Findings Past Medical History: No date: Fibrocystic breast disease No date: History of kidney stones No date: Hyperlipidemia No date: Kidney stone No date: Osteoporosis  Reproductive/Obstetrics                             Anesthesia Physical  Anesthesia Plan  ASA: 2  Anesthesia Plan: General   Post-op Pain Management: Minimal or no pain anticipated   Induction: Intravenous  PONV Risk Score and Plan: 3 and Propofol infusion, TIVA and Ondansetron  Airway Management Planned: Natural Airway  Additional Equipment: None  Intra-op Plan:   Post-operative Plan:   Informed Consent: I have reviewed the patients History and Physical, chart, labs and discussed the procedure including the risks, benefits and alternatives for the  proposed anesthesia with the patient or authorized representative who has indicated his/her understanding and acceptance.     Dental advisory given  Plan Discussed with: CRNA  Anesthesia Plan Comments: (Discussed risks of anesthesia with patient, including possibility of difficulty with spontaneous ventilation under anesthesia necessitating airway intervention, PONV, and rare risks such as cardiac or respiratory or neurological events, and allergic reactions. Discussed the role of CRNA in patient's perioperative care. Patient understands.)        Anesthesia Quick Evaluation

## 2021-07-24 ENCOUNTER — Encounter: Payer: Self-pay | Admitting: Gastroenterology

## 2021-07-25 LAB — SURGICAL PATHOLOGY

## 2021-09-14 DIAGNOSIS — E7800 Pure hypercholesterolemia, unspecified: Secondary | ICD-10-CM | POA: Diagnosis not present

## 2021-09-14 DIAGNOSIS — Z0001 Encounter for general adult medical examination with abnormal findings: Secondary | ICD-10-CM | POA: Diagnosis not present

## 2021-09-21 DIAGNOSIS — E7800 Pure hypercholesterolemia, unspecified: Secondary | ICD-10-CM | POA: Diagnosis not present

## 2021-09-21 DIAGNOSIS — Z0001 Encounter for general adult medical examination with abnormal findings: Secondary | ICD-10-CM | POA: Diagnosis not present

## 2021-09-21 DIAGNOSIS — M810 Age-related osteoporosis without current pathological fracture: Secondary | ICD-10-CM | POA: Diagnosis not present

## 2021-09-21 DIAGNOSIS — Z0000 Encounter for general adult medical examination without abnormal findings: Secondary | ICD-10-CM | POA: Diagnosis not present

## 2021-11-16 ENCOUNTER — Other Ambulatory Visit: Payer: Self-pay | Admitting: Internal Medicine

## 2021-11-16 DIAGNOSIS — Z1231 Encounter for screening mammogram for malignant neoplasm of breast: Secondary | ICD-10-CM

## 2021-12-12 DIAGNOSIS — M5481 Occipital neuralgia: Secondary | ICD-10-CM | POA: Diagnosis not present

## 2021-12-21 ENCOUNTER — Ambulatory Visit
Admission: RE | Admit: 2021-12-21 | Discharge: 2021-12-21 | Disposition: A | Discharge status: Home / Self Care | Payer: PPO | Source: Ambulatory Visit | Attending: Internal Medicine | Admitting: Internal Medicine

## 2021-12-21 DIAGNOSIS — Z1231 Encounter for screening mammogram for malignant neoplasm of breast: Secondary | ICD-10-CM | POA: Insufficient documentation

## 2022-02-22 DIAGNOSIS — H2513 Age-related nuclear cataract, bilateral: Secondary | ICD-10-CM | POA: Diagnosis not present

## 2022-02-22 DIAGNOSIS — H401131 Primary open-angle glaucoma, bilateral, mild stage: Secondary | ICD-10-CM | POA: Diagnosis not present

## 2022-08-24 ENCOUNTER — Ambulatory Visit
Admission: EM | Admit: 2022-08-24 | Discharge: 2022-08-24 | Disposition: A | Discharge status: Home / Self Care | Payer: PPO | Attending: Nurse Practitioner | Admitting: Nurse Practitioner

## 2022-08-24 DIAGNOSIS — L237 Allergic contact dermatitis due to plants, except food: Secondary | ICD-10-CM | POA: Diagnosis not present

## 2022-08-24 MED ORDER — PREDNISONE 20 MG PO TABS: 40.0000 mg | ORAL_TABLET | Freq: Every day | ORAL | 0 refills | Status: AC

## 2022-08-24 NOTE — ED Triage Notes (Signed)
Pt c/o poison ivy x6days  Pt states that she was outside trying to clean the yard when the rash started.   Pt is using calamine lotion to help with the itching.

## 2022-08-24 NOTE — ED Provider Notes (Signed)
[facility]    CSN: [id number] Arrival date & time: 08/24/22  [id number]      History   Chief Complaint Chief Complaint  Patient presents with   Poison Ivy    HPI Linda Gentry is a 77 y.o. female presents for evaluation of poison ivy exposure.  Patient reports last weekend she was cleaning out her yard and came in contact with poison ivy.  She has since had a vesicular pruritic rash bilateral forearms and a small patch on her right chin.  Denies any drainage, swelling, fevers, chills.  No history of eczema or psoriasis.  She has been using calamine lotion with some improvement in the itching.  No other concerns at this time.   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    Past Surgical History:  Procedure Laterality Date   BREAST BIOPSY Left 1965   neg   COLONOSCOPY N/A 07/21/2021   Procedure: COLONOSCOPY;  Surgeon: [name], MD;  Location: [facility] ENDOSCOPY;  Service: Endoscopy;  Laterality: N/A;   COLONOSCOPY WITH PROPOFOL N/A 11/01/2015   Procedure: COLONOSCOPY WITH PROPOFOL;  Surgeon: Lollie Sails, MD;  Location: [facility] ENDOSCOPY;  Service: Endoscopy;  Laterality: N/A;   CYSTOSCOPY W/ URETERAL STENT PLACEMENT Right 02/15/2016   Procedure: CYSTOSCOPY WITH RETROGRADE PYELOGRAM/URETERAL STENT PLACEMENT;  Surgeon: Nickie Retort, MD;  Location: [facility] ORS;  Service: Urology;  Laterality: Right;   CYSTOSCOPY W/ URETERAL STENT PLACEMENT Right 03/14/2016   Procedure: CYSTOSCOPY WITH STENT REPLACEMENT;  Surgeon: Nickie Retort, MD;  Location: [facility] ORS;  Service: Urology;  Laterality: Right;   URETEROSCOPY WITH HOLMIUM LASER LITHOTRIPSY Right 03/14/2016   Procedure: URETEROSCOPY WITH HOLMIUM LASER  LITHOTRIPSY;  Surgeon: Nickie Retort, MD;  Location: [facility] ORS;  Service: Urology;  Laterality: Right;    OB History   No obstetric history on file.      Home Medications    Prior to Admission medications   Medication Sig Start Date End Date Taking? Authorizing Provider  acyclovir ointment (ZOVIRAX) 5 % Apply to affect area 5 times daily until gone. 02/11/18  Yes Cook, Barnie Del, DO  Calcium-Magnesium-Vitamin D (CALCIUM 500 PO) Take 1 tablet by mouth 2 (two) times daily.    Yes [provider]  Multiple Vitamin (MULTIVITAMIN) capsule Take 1 capsule by mouth daily.   Yes [provider]  pravastatin (PRAVACHOL) 20 MG tablet Take 20 mg by mouth at bedtime.    Yes [provider]  predniSONE (DELTASONE) 20 MG tablet Take 2 tablets (40 mg total) by mouth daily with breakfast for 5 days. 08/24/22 08/29/22 Yes Melynda Ripple, NP  timolol (TIMOPTIC) 0.5 % ophthalmic solution Place 1 drop into both eyes daily.   Yes [provider]  triamcinolone ointment (KENALOG) 0.5 % Apply 1 application topically 2 (two) times daily. 02/11/18  Yes Cook, Jayce G, DO  acyclovir ointment (ZOVIRAX) 5 % Apply to affected area 5 times daily. 02/11/18   Coral Spikes, DO  triamcinolone ointment (KENALOG) 0.5 % Apply 1 application topically 2 (two) times daily. 02/11/18   Coral Spikes, DO    Family History Family History  Problem Relation Age of Onset  Breast cancer Neg Hx    Kidney cancer Neg Hx    Kidney disease Neg Hx    Prostate cancer Neg Hx     Social History Social History   Tobacco Use   Smoking status: Never   Smokeless tobacco: Never  Vaping Use   Vaping Use: Never used  Substance Use Topics   Alcohol use: No   Drug use: No     Allergies   Patient has no known allergies.   Review of Systems Review of Systems  Skin:  Positive for rash.     Physical Exam Triage Vital Signs ED Triage Vitals  Enc Vitals Group     BP 08/24/22 0846 (!) 148/75      Pulse Rate 08/24/22 0846 62     Resp --      Temp 08/24/22 0846 97.8 F (36.6 C)     Temp Source 08/24/22 0846 Oral     SpO2 08/24/22 0846 97 %     Weight 08/24/22 0844 125 lb (56.7 kg)     Height 08/24/22 0844 5' (1.524 m)     Head Circumference --      Peak Flow --      Pain Score 08/24/22 0844 10     Pain Loc --      Pain Edu? --      Excl. in [place]? --    No data found.  Updated Vital Signs BP (!) 148/75 (BP Location: Left Arm)   Pulse 62   Temp 97.8 F (36.6 C) (Oral)   Ht 5' (1.524 m)   Wt 125 lb (56.7 kg)   SpO2 97%   BMI 24.41 kg/m   Visual Acuity Right Eye Distance:   Left Eye Distance:   Bilateral Distance:    Right Eye Near:   Left Eye Near:    Bilateral Near:     Physical Exam Vitals and nursing note reviewed.  Constitutional:      Appearance: Normal appearance.  HENT:     Head: Normocephalic and atraumatic.  Eyes:     Pupils: Pupils are equal, round, and reactive to light.  Cardiovascular:     Rate and Rhythm: Normal rate.  Pulmonary:     Effort: Pulmonary effort is normal.  Skin:    General: Skin is warm and dry.     Comments: Linear vesicular rash on bilateral forearms and small patch on right chin.  No drainage, swelling, warmth  Neurological:     General: No focal deficit present.     Mental Status: She is alert and oriented to person, place, and time.  Psychiatric:        Mood and Affect: Mood normal.        Behavior: Behavior normal.      UC Treatments / Results  Labs (all labs ordered are listed, but only abnormal results are displayed) Labs Reviewed - No data to display  EKG   Radiology No results found.  Procedures Procedures (including critical care time)  Medications Ordered in UC Medications - No data to display  Initial Impression / Assessment and Plan / UC Course  I have reviewed the triage vital signs and the nursing notes.  Pertinent labs & imaging results that were available during my care of the patient were  reviewed by me and considered in my medical decision making (see chart for details).     Calamine lotion as needed Prednisone for 5 days May use OTC Benadryl as well as needed  for itching PCP follow-up if symptoms do not improve ER precautions reviewed and patient verbalized understanding Final Clinical Impressions(s) / UC Diagnoses   Final diagnoses:  Poison ivy dermatitis     Discharge Instructions      Continue over-the-counter calamine lotion as needed Benadryl as needed Prednisone daily for 5 days Follow-up with your PCP if your symptoms do not improve Please go to the ER for any worsening symptoms   ED Prescriptions     Medication Sig Dispense Auth. Provider   predniSONE (DELTASONE) 20 MG tablet Take 2 tablets (40 mg total) by mouth daily with breakfast for 5 days. 10 tablet Melynda Ripple, NP      PDMP not reviewed this encounter.   Melynda Ripple, NP 08/24/22 0900

## 2022-08-24 NOTE — Discharge Instructions (Addendum)
Continue over-the-counter calamine lotion as needed Benadryl as needed Prednisone daily for 5 days Follow-up with your PCP if your symptoms do not improve Please go to the ER for any worsening symptoms

## 2022-08-30 DIAGNOSIS — H2513 Age-related nuclear cataract, bilateral: Secondary | ICD-10-CM | POA: Diagnosis not present

## 2022-08-30 DIAGNOSIS — H401131 Primary open-angle glaucoma, bilateral, mild stage: Secondary | ICD-10-CM | POA: Diagnosis not present

## 2022-09-20 DIAGNOSIS — E7800 Pure hypercholesterolemia, unspecified: Secondary | ICD-10-CM | POA: Diagnosis not present

## 2022-09-20 DIAGNOSIS — Z0001 Encounter for general adult medical examination with abnormal findings: Secondary | ICD-10-CM | POA: Diagnosis not present

## 2022-09-27 DIAGNOSIS — Z0000 Encounter for general adult medical examination without abnormal findings: Secondary | ICD-10-CM | POA: Diagnosis not present

## 2022-09-27 DIAGNOSIS — M25511 Pain in right shoulder: Secondary | ICD-10-CM | POA: Diagnosis not present

## 2022-09-27 DIAGNOSIS — M810 Age-related osteoporosis without current pathological fracture: Secondary | ICD-10-CM | POA: Diagnosis not present

## 2022-09-27 DIAGNOSIS — E7800 Pure hypercholesterolemia, unspecified: Secondary | ICD-10-CM | POA: Diagnosis not present

## 2022-09-27 DIAGNOSIS — Z0001 Encounter for general adult medical examination with abnormal findings: Secondary | ICD-10-CM | POA: Diagnosis not present

## 2022-10-02 DIAGNOSIS — M778 Other enthesopathies, not elsewhere classified: Secondary | ICD-10-CM | POA: Diagnosis not present

## 2022-10-02 DIAGNOSIS — M25511 Pain in right shoulder: Secondary | ICD-10-CM | POA: Diagnosis not present

## 2022-11-20 ENCOUNTER — Other Ambulatory Visit: Payer: Self-pay | Admitting: Internal Medicine

## 2022-11-20 DIAGNOSIS — Z1231 Encounter for screening mammogram for malignant neoplasm of breast: Secondary | ICD-10-CM

## 2022-12-27 ENCOUNTER — Ambulatory Visit
Admission: RE | Admit: 2022-12-27 | Discharge: 2022-12-27 | Disposition: A | Discharge status: Home / Self Care | Payer: PPO | Source: Ambulatory Visit | Attending: Internal Medicine | Admitting: Internal Medicine

## 2022-12-27 DIAGNOSIS — Z1231 Encounter for screening mammogram for malignant neoplasm of breast: Secondary | ICD-10-CM | POA: Diagnosis not present

## 2023-01-24 ENCOUNTER — Ambulatory Visit
Admission: EM | Admit: 2023-01-24 | Discharge: 2023-01-24 | Disposition: A | Discharge status: Home / Self Care | Payer: PPO | Attending: Physician Assistant | Admitting: Physician Assistant

## 2023-01-24 DIAGNOSIS — R051 Acute cough: Secondary | ICD-10-CM

## 2023-01-24 DIAGNOSIS — U071 COVID-19: Secondary | ICD-10-CM

## 2023-01-24 DIAGNOSIS — J029 Acute pharyngitis, unspecified: Secondary | ICD-10-CM | POA: Diagnosis not present

## 2023-01-24 DIAGNOSIS — Z20822 Contact with and (suspected) exposure to covid-19: Secondary | ICD-10-CM | POA: Diagnosis not present

## 2023-01-24 LAB — SARS CORONAVIRUS 2 BY RT PCR: SARS Coronavirus 2 by RT PCR: POSITIVE — AB

## 2023-01-24 MED ORDER — NIRMATRELVIR/RITONAVIR (PAXLOVID)TABLET
3.0000 | ORAL_TABLET | Freq: Two times a day (BID) | ORAL | 0 refills | Status: AC
Start: 2023-01-24 — End: 2023-01-29

## 2023-01-24 NOTE — Discharge Instructions (Addendum)
-  COVID is positive. Isolate until fever free >24 hours and symptoms are improving. -I sent Paxlovid to pharmacy. -Increase rest and fluids. Can take Tylenol for fever/aches. -May take Mucinex for congestion and use throat lozenges, throat sprays. -Need to be seen again if uncontrollable fever, weakness, shortness of breath.

## 2023-01-24 NOTE — ED Provider Notes (Signed)
[facility]    CSN: [id number] Arrival date & time: 01/24/23  1647      History   Chief Complaint Chief Complaint  Patient presents with   Cough   Sore Throat    HPI Linda Gentry is a 77 y.o. female presenting for cough, congestion, chills, sore throat and fatigue that began yesterday.  Denies high-grade fever.  Current temp 100 degrees.  Denies chest pain or breathing difficulty, vomiting or diarrhea.  Reports that her boss was seen today at this urgent care and tested positive for COVID.  She has not been taking any OTC meds.  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    Past Surgical History:  Procedure Laterality Date   BREAST BIOPSY Left 1965   neg   COLONOSCOPY N/A 07/21/2021   Procedure: COLONOSCOPY;  Surgeon: Regis Bill, MD;  Location: [facility] ENDOSCOPY;  Service: Endoscopy;  Laterality: N/A;   COLONOSCOPY WITH PROPOFOL N/A 11/01/2015   Procedure: COLONOSCOPY WITH PROPOFOL;  Surgeon: Christena Deem, MD;  Location: [facility] END[facility]COPY;  Service: Endoscopy;  Laterality: N/A;   CYSTOSCOPY W/ URETERAL STENT PLACEMENT Right 02/15/2016   Procedure: CYSTOSCOPY WITH RETROGRADE PYELOGRAM/URETERAL STENT PLACEMENT;  Surgeon: Hildred Laser, MD;  Location: [facility] OR[facility];  Service: Urology;  Laterality: Right;   CYSTOSCOPY W/ URETERAL STENT PLACEMENT Right 03/14/2016   Procedure: CYSTOSCOPY WITH STENT REPLACEMENT;  Surgeon: Hildred Laser, MD;  Location: [facility] ORS;  Service: Urology;  Laterality: Right;   URETEROSCOPY WITH HOLMIUM LASER LITHOTRIPSY Right 03/14/2016   Procedure: URETEROSCOPY WITH HOLMIUM LASER LITHOTRIPSY;  Surgeon: Hildred Laser, MD;  Location: [facility] OR[facility];  Service: Urology;  Laterality: Right;     OB History   No obstetric history on file.      Home Medications    Prior to Admission medications   Medication Sig Start Date End Date Taking? Authorizing Provider  acyclovir ointment (ZOVIRAX) 5 % Apply to affect area 5 times daily until gone. 02/11/18  Yes Cook, Jayce G, DO  acyclovir ointment (ZOVIRAX) 5 % Apply to affected area 5 times daily. 02/11/18  Yes Cook, Verdis Frederickson, DO  Calcium-Magnesium-Vitamin D (CALCIUM 500 PO) Take 1 tablet by mouth 2 (two) times daily.    Yes [provider]  Multiple Vitamin (MULTIVITAMIN) capsule Take 1 capsule by mouth daily.   Yes [provider]  nirmatrelvir/ritonavir (PAXLOVID) 20 x 150 MG & 10 x 100MG  TABS Take 3 tablets by mouth 2 (two) times daily for 5 days. Patient GFR is 92. Take nirmatrelvir (150 mg) two tablets twice daily for 5 days and ritonavir (100 mg) one tablet twice daily for 5 days. 01/24/23 01/29/23 Yes Eusebio Friendly B, PA-C  pravastatin (PRAVACHOL) 20 MG tablet Take 20 mg by mouth at bedtime.    Yes [provider]  timolol (TIMOPTIC) 0.5 % ophthalmic solution Place 1 drop into both eyes daily.   Yes [provider]  triamcinolone ointment (KENALOG) 0.5 % Apply 1 application topically 2 (two) times daily. 02/11/18  Yes Cook, Jayce G, DO  triamcinolone ointment (KENALOG) 0.5 % Apply 1 application topically 2 (two) times daily. 02/11/18  Yes Tommie Sams, DO    Family History Family History  Problem Relation Age of Onset   Breast cancer Neg Hx    Kidney cancer Neg Hx    Kidney disease Neg Hx    Prostate cancer Neg Hx     Social History Social History   Tobacco Use   Smoking status: Never   Smokeless tobacco: Never  Vaping Use   Vaping status: Never Used  Substance Use Topics   Alcohol use: No   Drug use: No     Allergies   Patient has no known allergies.   Review of Systems Review of Systems  Constitutional:  Positive for chills, fatigue and fever. Negative for diaphoresis.   HENT:  Positive for congestion, rhinorrhea and sore throat. Negative for ear pain, sinus pressure and sinus pain.   Respiratory:  Positive for cough. Negative for shortness of breath.   Cardiovascular:  Negative for chest pain.  Gastrointestinal:  Negative for abdominal pain, nausea and vomiting.  Musculoskeletal:  Negative for arthralgias and myalgias.  Skin:  Negative for rash.  Neurological:  Negative for weakness and headaches.  Hematological:  Negative for adenopathy.     Physical Exam Triage Vital Signs ED Triage Vitals  Encounter Vitals Group     BP      Systolic BP Percentile      Diastolic BP Percentile      Pulse      Resp      Temp      Temp src      SpO2      Weight      Height      Head Circumference      Peak Flow      Pain Score      Pain Loc      Pain Education      Exclude from Growth Chart    No data found.  Updated Vital Signs BP 131/76 (BP Location: Left Arm)   Pulse 82   Temp 100 F (37.8 C) (Oral)   Ht 5' (1.524 m)   Wt 127 lb (57.6 kg)   SpO2 93%   BMI 24.80 kg/m    Physical Exam Vitals and nursing note reviewed.  Constitutional:      General: She is not in acute distress.    Appearance: Normal appearance. She is not ill-appearing or toxic-appearing.  HENT:     Head: Normocephalic and atraumatic.     Nose: Congestion present.     Mouth/Throat:     Mouth: Mucous membranes are moist.     Pharynx: Oropharynx is clear. Posterior oropharyngeal erythema present.  Eyes:     General: No scleral icterus.       Right eye: No discharge.        Left eye: No discharge.     Conjunctiva/sclera: Conjunctivae normal.  Cardiovascular:     Rate and Rhythm: Normal rate and regular rhythm.     Heart sounds: Normal heart sounds.  Pulmonary:     Effort: Pulmonary effort is normal. No respiratory distress.     Breath sounds: Normal breath sounds.  Musculoskeletal:     Cervical back: Neck supple.  Skin:    General: Skin is dry.  Neurological:      General: No focal deficit present.     Mental Status: She is alert. Mental status is at baseline.     Motor: No weakness.     Gait: Gait normal.  Psychiatric:        Mood and Affect: Mood normal.  Behavior: Behavior normal.        Thought Content: Thought content normal.      UC Treatments / Results  Labs (all labs ordered are listed, but only abnormal results are displayed) Labs Reviewed  SARS CORONAVIRUS 2 BY RT PCR - Abnormal; Notable for the following components:      Result Value   SARS Coronavirus 2 by RT PCR POSITIVE (*)    All other components within normal limits    EKG   Radiology No results found.  Procedures Procedures (including critical care time)  Medications Ordered in UC Medications - No data to display  Initial Impression / Assessment and Plan / UC Course  I have reviewed the triage vital signs and the nursing notes.  Pertinent labs & imaging results that were available during my care of the patient were reviewed by me and considered in my medical decision making (see chart for details).   77 year old female presents for 2-day history of fatigue, chills, low-grade fever, cough, congestion, sore throat.  Has been exposed to COVID.  Vitals are stable.  Temp 100 degrees.  She is overall well-appearing.  On exam has nasal congestion and mild posterior pharyngeal erythema.  Chest clear.  COVID testing obtained..  Reviewed current CDC guidelines, isolation protocol and ED precautions.  She is interested in Paxlovid.  Last GFR done in April was 92.  Advised to hold statin for 10 days.  Reviewed use of OTC meds as needed for symptoms.  Reviewed return as needed.   Final Clinical Impressions(s) / UC Diagnoses   Final diagnoses:  COVID-19  Acute cough  Sore throat  Exposure to COVID-19 virus     Discharge Instructions      -COVID is positive. Isolate until fever free >24 hours and symptoms are improving. -I sent Paxlovid to  pharmacy. -Increase rest and fluids. Can take Tylenol for fever/aches. -May take Mucinex for congestion and use throat lozenges, throat sprays. -Need to be seen again if uncontrollable fever, weakness, shortness of breath.     ED Prescriptions     Medication Sig Dispense Auth. Provider   nirmatrelvir/ritonavir (PAXLOVID) 20 x 150 MG & 10 x 100MG  TABS Take 3 tablets by mouth 2 (two) times daily for 5 days. Patient GFR is 92. Take nirmatrelvir (150 mg) two tablets twice daily for 5 days and ritonavir (100 mg) one tablet twice daily for 5 days. 30 tablet Gareth Morgan      PDMP not reviewed this encounter.   Shirlee Latch, PA-C 01/24/23 1735

## 2023-01-24 NOTE — ED Triage Notes (Signed)
Pt c/o covid exposure.  Pt states that her boss has been coming to work and was seen this morning and tested positive to covid.  Pt has been having cough, fatigue, and sore throat x1day

## 2023-03-07 DIAGNOSIS — H2513 Age-related nuclear cataract, bilateral: Secondary | ICD-10-CM | POA: Diagnosis not present

## 2023-03-07 DIAGNOSIS — H40013 Open angle with borderline findings, low risk, bilateral: Secondary | ICD-10-CM | POA: Diagnosis not present

## 2023-09-05 DIAGNOSIS — H2513 Age-related nuclear cataract, bilateral: Secondary | ICD-10-CM | POA: Diagnosis not present

## 2023-09-05 DIAGNOSIS — H401131 Primary open-angle glaucoma, bilateral, mild stage: Secondary | ICD-10-CM | POA: Diagnosis not present

## 2023-09-26 DIAGNOSIS — Z0000 Encounter for general adult medical examination without abnormal findings: Secondary | ICD-10-CM | POA: Diagnosis not present

## 2023-10-03 DIAGNOSIS — Z0001 Encounter for general adult medical examination with abnormal findings: Secondary | ICD-10-CM | POA: Diagnosis not present

## 2023-10-03 DIAGNOSIS — M810 Age-related osteoporosis without current pathological fracture: Secondary | ICD-10-CM | POA: Diagnosis not present

## 2023-10-03 DIAGNOSIS — Z0000 Encounter for general adult medical examination without abnormal findings: Secondary | ICD-10-CM | POA: Diagnosis not present

## 2023-10-03 DIAGNOSIS — E7800 Pure hypercholesterolemia, unspecified: Secondary | ICD-10-CM | POA: Diagnosis not present

## 2023-10-24 DIAGNOSIS — M810 Age-related osteoporosis without current pathological fracture: Secondary | ICD-10-CM | POA: Diagnosis not present

## 2023-12-05 ENCOUNTER — Other Ambulatory Visit: Payer: Self-pay | Admitting: Internal Medicine

## 2023-12-05 DIAGNOSIS — Z1231 Encounter for screening mammogram for malignant neoplasm of breast: Secondary | ICD-10-CM

## 2024-01-08 ENCOUNTER — Ambulatory Visit
Admission: RE | Admit: 2024-01-08 | Discharge: 2024-01-08 | Disposition: A | Discharge status: Home / Self Care | Source: Ambulatory Visit | Attending: Internal Medicine | Admitting: Internal Medicine

## 2024-01-08 DIAGNOSIS — Z1231 Encounter for screening mammogram for malignant neoplasm of breast: Secondary | ICD-10-CM | POA: Insufficient documentation

## 2024-03-05 DIAGNOSIS — H401131 Primary open-angle glaucoma, bilateral, mild stage: Secondary | ICD-10-CM | POA: Diagnosis not present

## 2024-03-05 DIAGNOSIS — H2513 Age-related nuclear cataract, bilateral: Secondary | ICD-10-CM | POA: Diagnosis not present
